# Patient Record
Sex: Male | Born: 1950
Health system: Midwestern US, Community
[De-identification: ages and names within clinical notes are randomized; demographics above are authoritative.]

## PROBLEM LIST (undated history)

## (undated) DIAGNOSIS — G629 Polyneuropathy, unspecified: Secondary | ICD-10-CM

## (undated) DIAGNOSIS — I10 Essential (primary) hypertension: Secondary | ICD-10-CM

## (undated) DIAGNOSIS — N400 Enlarged prostate without lower urinary tract symptoms: Secondary | ICD-10-CM

## (undated) DIAGNOSIS — M5431 Sciatica, right side: Secondary | ICD-10-CM

## (undated) HISTORY — PX: SHOULDER SURGERY: SHX246

## (undated) HISTORY — DX: Polyneuropathy, unspecified: G62.9

## (undated) HISTORY — DX: Essential (primary) hypertension: I10

## (undated) HISTORY — PX: LITHOTRIPSY: SUR834

## (undated) HISTORY — PX: ABDOMINAL SURGERY: SHX537

## (undated) HISTORY — PX: KNEE SURGERY: SHX244

## (undated) HISTORY — PX: NEPHROSTOMY: SHX1014

## (undated) HISTORY — DX: Benign prostatic hyperplasia without lower urinary tract symptoms: N40.0

## (undated) HISTORY — DX: Sciatica, right side: M54.31

---

## 2016-02-15 LAB — GLUCOSE, POC
Glucose (POC): 128 MG/DL — ABNORMAL HIGH (ref 70–110)
Performed by: 35714

## 2016-02-15 LAB — CBC WITH AUTOMATED DIFF
ABS. BASOPHILS: 0 10*3/uL (ref 0.00–0.10)
ABS. EOSINOPHILS: 0.2 10*3/uL (ref 0.00–0.50)
ABS. IMM. GRANS.: 0.03 10*3/uL (ref 0–0.03)
ABS. LYMPHOCYTES: 1.2 10*3/uL (ref 1.20–3.70)
ABS. MONOCYTES: 0.6 10*3/uL (ref 0.20–0.80)
ABS. NEUTROPHILS: 7.4 10*3/uL — ABNORMAL HIGH (ref 1.56–6.13)
BASOPHILS: 0.2 %
EOSINOPHILS: 1.7 %
HCT: 42.3 % (ref 40.1–51.0)
HGB: 14.2 GM/DL (ref 13.7–17.5)
IMMATURE GRANULOCYTES: 0.3 % (ref 0–0.43)
LYMPHOCYTES: 12.8 %
MCH: 28.2 PG (ref 25.6–32.2)
MCHC: 33.6 G/DL (ref 32.2–35.5)
MCV: 84.1 FL (ref 80–95)
MONOCYTES: 6.1 %
MPV: 10 FL (ref 9.4–12.4)
NEUTROPHILS: 78.9 %
NRBC: 0 /100 WBC (ref 0–0.02)
PLATELET: 190 10*3/uL (ref 150–400)
RBC: 5.03 M/ul (ref 4.51–5.93)
RDW: 14.4 % (ref 11.6–14.4)
WBC: 9.4 10*3/uL (ref 4.0–10.0)

## 2016-02-15 LAB — METABOLIC PANEL, BASIC
Anion gap: 9.9 MMOL/L (ref 5–15)
BUN: 17 MG/DL (ref 7–18)
CO2: 25.1 MMOL/L (ref 21–32)
Calcium: 8.7 MG/DL (ref 8.5–10.1)
Chloride: 103 MMOL/L (ref 98–110)
Creatinine: 1.3 mg/dL (ref 0.7–1.3)
Estimated GFR: 55 mL/min/{1.73_m2} — ABNORMAL LOW (ref >60)
Glucose: 135 MG/DL (ref 74–140)
Potassium: 4.2 MMOL/L (ref 3.5–5.1)
Sodium: 138 MMOL/L (ref 136–145)

## 2016-02-15 LAB — LIPASE: Lipase: 157.6 U/L (ref 73–393)

## 2016-02-16 NOTE — H&P (Signed)
[facility] [address] [place] [id number] [phone]      HISTORY AND PHYSICAL    Patient Name: Omar FavorBOURQUE , Gerad Medical Record Number: [id number]   Account Number: [id number] DOB: 08 / 11 / 1952   Admit Date: 10 / 27 / 2017 Discharge Date: / /         DATE OF ADMISSION:  02/15/2016    CHIEF COMPLAINT:  Dizziness.    HISTORY OF PRESENT ILLNESS:  The patient is a 65 year old man with a history of coronary artery disease, who  came to the emergency room complaining of dizziness.  He says that he was home  today and suddenly around noon he got up to go out with his wife, and he  suddenly felt very dizzy.  He said it felt like he was spinning around.  He  went in to lie down in bed for awhile and his symptoms felt worse when he was  in bed.  He then started having nausea and vomiting.  He and his wife were  concerned that he was having some cardiac problems, called an ambulance, and he  was brought to the emergency room.  He did receive some Zofran and Valium, and  was feeling slightly better; however, when he stood up to try to go home, he  became very dizzy and unsteady.  He also had some nausea without vomiting.  He  is not complaining of any chest pain, shortness of breath or abdominal pain.  No headache.  He had no visual changes.    PAST MEDICAL HISTORY:  1.  Coronary artery disease; he said 2 years ago he was found have a 99%  stenosis in 1 of his coronary arteries, he had a stent placed, has not have any  problems since then.  2.  Benign prostatic hypertrophy.  3.  Hyperlipidemia.  4.  History of diverticulitis; he did require surgery last year.    MEDICATIONS:  Aspirin 81 milligrams a day.  Atorvastatin 80 milligrams daily.  Effient 10 milligrams a day.  Metoprolol tartrate 25 milligrams twice a day.  Tamsulosin 0.4 milligrams once a day.    ALLERGIES:  VICODIN.    FAMILY HISTORY:  Mother had a massive heart attack and died at age 66, she was diabetic.  Does  not know about his father.     SOCIAL HISTORY:  He is married.  Does not smoke or drink alcohol.    Review of systems.:  Neurologic:  He does complain of some numbness and tingling in his feet.  Cardiac:  He has not had any chest pain since his stent was placed 2 years ago.  No palpitations or shortness of breath.  All other systems were reviewed and  were negative except for the history of present illness.    PHYSICAL EXAMINATION:  He did not appear to be acutely ill or in any distress.  Temperature was 36.3,  pulse 64, respirations 16, blood pressure 149/79.  HEENT exam:  Sclerae were  nonicteric.  He had no periorbital edema.  Neck:  No thyromegaly or adenopathy.  Heart:  Was regular, no murmur or gallops.  Lungs:  Clear to auscultation  bilaterally.  Abdomen:  Soft and nontender.  He had no mass or organomegaly.  Musculoskeletal:  He had no clubbing, cyanosis or calf tenderness.  He did have  a small abrasion on the second toe on the right foot.  Neurologic:  He is  alert, appropriated, appropriate.  Extraocular movements are Intact.  He did  not have any focal weakness.  He did not have any nystagmus.    Electrocardiogram showed sinus rhythm with a nonspecific intraventricular  conduction delay.    IMPRESSION:  1.  Dizziness.  It sounds like vertigo, most likely acute labyrinthitis.  He  was unable to stand and walk, and could not be safely discharged from the  emergency room.  He does not have any symptoms suggesting cerebrovascular  disease.  Will be admitted for overnight observation..  2.  Coronary artery disease is stable on aspirin, Effient and metoprolol.  3.  Dyslipidemia.  On atorvastatin.  4.  Benign prostatic hypertrophy.  On tamsulosin.    The patient does not have an advance directive.  He is a FULL CODE.      ______________________________  Isabella BowensKevin J Agustine Rossitto, MD    KJG/ac  D:  02/15/2016  11:54 PM  T:  02/16/2016  05:00 AM  DVI Job #:  [id number]  Fusion Job #:  [id number]  Doc#:  [id number]    CC: Isabella BowensKevin J Eugenia Eldredge, MD  [facility]   [address]  [place], [place] [id number]  Fax#:  [phone]  Lysbeth PennerSunit Mukherjee    Electronically Authenticated by:  Marlene LardKevin Labib Cwynar, MD On 02/17/2016 06:31 AM EDT

## 2016-02-17 NOTE — Discharge Summary (Signed)
[facility] [address] [place] [id number] [phone]      DISCHARGE SUMMARY    Patient Name: Omar FavorBOURQUE , Davie Medical Record Number: [id number]  Account Number: [id number] DOB: 08 / 11 / 1952  Admit Date: 10 / 27 / 2017 Discharge Date: 10 / 29 / 2017        DATE OF BIRTH:  [birth year].    1.  Dizziness due to vasovagal effect, vertigo, acute, initial encounter   improved.    SECONDARY DIAGNOSIS:  1.  Obesity; BMI is 33.43.  2.  Coronary artery disease with one cardiac stent placed.  3.  Dyslipidemia.  4.  Benign prostatic hyperplasia.    OPERATIONS AND PROCEDURES:  During the hospital stay the patient was evaluated with the following   investigations  CT scan of the head without contrast from 02/15/2016:  Impression:  No acute   intracranial hemorrhage or infarct.    X-ray chest from 02/15/2016:  Impression bibasilar densities compatible with   atelectasis/scarring.  Right hilar prominence.    Lipase level from 02/15/2016:  157.6 units/liter.    Chemistry-7 from 02/15/2016 sodium 138 millimoles per liter, potassium 4.2   millimoles per liter, chloride 103 millimoles per liter, CO2 is 25.1 millimoles   per liter, BUN 17 milligrams per dL, creatinine 1.3 milligrams per dL, glucose   564135 milligrams per dL, calcium 8.7 milligrams per dL, anion gap 9.9 millimoles   per liter, GFR 55.    Complete blood count from 02/15/2016:  WBC 9.4, hemoglobin 14.2 g/dL,  hematocrit 33.2%42.3%, platelet 190.    DISCHARGE DIET: 2 grams sodium low-cholesterol diet.    DISCHARGE ACTIVITY: as tolerated.    CONSULTATIONS:  None.    FOLLOWUP PLAN:  The patient is advised to follow up with primary care physician in 1 to 2 weeks   and as needed.  The patient is advised to call for appointment.    SPECIAL INSTRUCTIONS:  1.  The patient is advised to get physical exercise and maintain healthy food   habits for obesity.  2.  The patient is advised to get CT scan of the chest for evaluation of right    hilar prominence after discussing with primary care physician.  3.  The patient is advised to return to the emergency room if he experiences   acute chest pain, acute shortness of breath.    CODE STATUS:  Full code.    DISCHARGE MEDICATIONS:  The patient will be discharged with the following medications.  Aspirin 81 mg p.o. daily as before.  Atorvastatin 80 mg p.o. at bedtime as intense statin therapy as before.  Effient 10 mg daily as before.  Meclizine 25 milligrams p.o. two times daily p.r.n. vertigo.  Metoprolol tartrate 25 milligrams p.o. two times daily as before.  Tamsulosin 0.4 milligrams daily as before.  The patient is provided a script for meclizine 25 milligrams on discharge.    CONDITION ON DISCHARGE:  I evaluated the patient today on 02/17/2016 and found comfortable.  He does not   have any orthostatic hypotension or tachycardia.  He is ambulating without any   distress as per [employer] Megan, who ambulated the patient on the floor.  The patient   denied any dizziness.  No blurry vision.  He denied any central chest pain, no   palpitations.  No cough, no pleuritic chest pain, no wheezing, no shortness of   breathing.  He denied any abdominal pain.  No nausea no vomiting, no  diarrhea.   No hematemesis, no melena, no hematochezia.  He denied any dysuria.  No   increased frequency of micturition, no hematuria, no retention of urine.  Vitals are as follows:  Temperature 36 degrees centigrade, pulse 68 beats per   minute, respirations 17 per minute, blood pressure 127/76 mmHg.  Oxygen   saturation is 94% on room air.  On examination, he is awake, alert, oriented x3.    Head:  Atraumatic, normocephalic.  Eyes:  Pupillary reflex are present equally bilateral.  No ptosis.  Extraocular   muscles are intact.  Ear, nose, throat: within normal limits.  Skin:  No ecchymosis, no rash.  Oral mucous membranes are moist.  No ulcer.  RN Carollee HerterShannon also checked the skin with me at the bedside and agreed.   Lymphatic system:  No lymph nodes are palpable in the cervical, axillary,   inguinal regions.  Neck:  Supple.  No JVD.  No carotid bruit.  No thyromegaly.  Respiratory system:  No wheezing, no rales, rhonchi.  Cardiovascular system:  S1, S2 are audible.  Regular rate and rhythm clinically.  No murmur or gallop rhythm.  Gastrointestinal system:  Abdomen:  Not distended, not tender, no peritoneal   signs.  No organomegaly.  Bowel sounds audible.  Obese.  Genitourinary system:  External genitalia are within normal limits as per the   patient.  External genitalia is not examined.  Musculoskeletal system:  No dependent edema.  Denna HaggardHomans' signs are negative   bilaterally.  Peripheral pulses are palpable in four limbs. Muscle power; 5/5   in 4 limbs.  Nervous system: Cranial II-XII and peripheral nervous systems are grossly   intact.  Psychiatric evaluation: cooperative, pleasant, not depressed.    As per discussion and agreement with the patient, he will be discharged to home   today.  During the evaluation discussion with the patient, first time RN Carollee HerterShannon and   second time RN Lindie SpruceMeghan was present at the bedside.  I informed the patient about the radiological findings of CT scan of the head   and x-ray chest.    HISTORY:  Date of admission 02/15/2016:  Date of discharge 02/17/2016:    65 year old gentleman with past medical history of coronary artery disease with   one cardiac stent placement, dyslipidemia, was evaluated and admitted for   dizziness and moderate unsteadiness.  Please refer to the history and physical for full history of the patient.    HOSPITAL COURSE:  During the hospital stay, the patient was improving of his dizziness.  Yesterday when I first evaluated the patient on 02/16/2016, he was still   complaining of dizziness. He was waiting to be evaluated by physical therapist.  He was evaluated by CT scan of the head which returned negative reported today.   The patient is ambulated by RN today and he is ambulating without any distress.  The patient denied any other medical complaints.  As the patient remained hemodynamically neurologically stable, I discussed with   the patient about discharge, followup, rehabilitation plan and he agreed and   appreciated to be discharged to home today.    I could not call the primary care physician as I do not have the primary care   physician's telephone number. I requested the patient to call and follow up   with primary care physician.  All questions were answered.    When I first started to follow the patient on 02/16/2016, I discussed with the   patient about palliative  care plan, advance directive and code status for which   I spent separately 16 minutes.  The patient confirmed FULL code status.    DISPOSITION:  The patient will be discharged to home with followup advised with the primary   care physician as advised and as needed.    Total time spent evaluating the patient, discussing with the patient about   discharge, followup, rehabilitation plan, writing script for meclizine and   completing discharge paperwork. :50 minutes.    ______________________________  Blima RichAlok K Genetta Fiero, MD    AKS/dp  D:  02/17/2016  10:59 AM  T:  02/19/2016  03:59 PM  DVI Job #:  [id number]  Fusion Job #:  [id number]  Doc#:  [id number]    CC: Blima RichAlok K Analiese Krupka, MD  [facility]  [address]  [place] [id number]  Lysbeth PennerSunit Mukherjee    Electronically Authenticated and Edited by:  Steward RosAlok Dominga Mcduffie, MD On 02/20/2016 08:05 PM EDT

## 2016-02-17 NOTE — Discharge Summary (Signed)
[facility] [address] [place] [address] [phone]      DISCHARGE SUMMARY    Patient Name: [name] , Omar Medical Record Number: [id number]  Account Number: [id number] DOB: 08 / 11 / 1952  Admit Date: 10 / 27 / 2017 Discharge Date: 10 / 29 / 2017        ADDENDUM    Add in the hospital course section:  I discussed with the patient about x-ray chest finding suggestive of right   hilar prominence, significance is not understood.  As per radiologist's   recommendation, follow-up chest CT was recommended for further evaluation.  The patient does not have any  chest pain.   I offered the patient  either he   can wait and I can get the CT scan of the chest before discharge or he can get   the CT scan of the chest as outpatient after discussing with primary care   physician. The patient wanted to get the CT scan of the chest as outpatient   after discussing with his primary care physician which was witnessed by RN,   Megan, at the bedside.        ______________________________  Davia Smyre K Adit Riddles, MD    AKS/cc  D:  02/17/2016  11:06 AM  T:  02/19/2016  03:42 PM  DVI Job #:  [id number]  Fusion Job #:  [id number]  Doc#:  [id number]  Electronically Authenticated and Edited by:  Daleen Steinhaus, MD On 02/20/2016 08:08 PM EDT

## 2016-02-17 NOTE — Discharge Summary (Signed)
[facility] [address] [place] [phone]      DISCHARGE SUMMARY    Patient Name: Omar FavorBOURQUE , Roque Medical Record Number: [id number]  Account Number: [id number] DOB: 08 / 11 / 1952  Admit Date: 10 / 27 / 2017 Discharge Date: 10 / 29 / 2017        DATE OF BIRTH:  April 09, 1951.    1.  Dizziness due to vasovagal effect, vertigo, acute, initial encounter   improved.    SECONDARY DIAGNOSIS:  1.  Obesity; BMI is 33.43.  2.  Coronary artery disease with one cardiac stent placed.  3.  Dyslipidemia.  4.  Benign prostatic hyperplasia.    OPERATIONS AND PROCEDURES:  During the hospital stay the patient was evaluated with the following   investigations  CT scan of the head without contrast from 02/15/2016:  Impression:  No acute   intracranial hemorrhage or infarct.    X-ray chest from 02/15/2016:  Impression bibasilar densities compatible with   atelectasis/scarring.  Right hilar prominence.    Lipase level from 02/15/2016:  157.6 units/liter.    Chemistry-7 from 02/15/2016 sodium 138 millimoles per liter, potassium 4.2   millimoles per liter, chloride 103 millimoles per liter, CO2 is 25.1 millimoles   per liter, BUN 17 milligrams per dL, creatinine 1.3 milligrams per dL, glucose   528135 milligrams per dL, calcium 8.7 milligrams per dL, anion gap 9.9 millimoles   per liter, GFR 55.    Complete blood count from 02/15/2016:  WBC 9.4, hemoglobin 14.2 g/dL,  hematocrit 41.3%42.3%, platelet 190.    DISCHARGE DIET: 2 grams sodium low-cholesterol diet.    DISCHARGE ACTIVITY: as tolerated.    CONSULTATIONS:  None.    FOLLOWUP PLAN:  The patient is advised to follow up with primary care physician in 1 to 2 weeks   and as needed.  The patient is advised to call for appointment.    SPECIAL INSTRUCTIONS:  1.  The patient is advised to get physical exercise and maintain healthy food   habits for obesity.  2.  The patient is advised to get CT scan of the chest for evaluation of right   hilar prominence after discussing with  primary care physician.  3.  The patient is advised to return to the emergency room if he experiences   acute chest pain, acute shortness of breath.    CODE STATUS:  Full code.    DISCHARGE MEDICATIONS:  The patient will be discharged with the following medications.  Aspirin 81 mg p.o. daily as before.  Atorvastatin 80 mg p.o. at bedtime as intense statin therapy as before.  Effient 10 mg daily as before.  Meclizine 25 milligrams p.o. two times daily p.r.n. vertigo.  Metoprolol tartrate 25 milligrams p.o. two times daily as before.  Tamsulosin 0.4 milligrams daily as before.  The patient is provided a script for meclizine 25 milligrams on discharge.    CONDITION ON DISCHARGE:  I evaluated the patient today on 02/17/2016 and found comfortable.  He does not   have any orthostatic hypotension or tachycardia.  He is ambulating without any   distress as per [employer] Megan, who ambulated the patient on the floor.  The patient   denied any dizziness.  No blurry vision.  He denied any central chest pain, no   palpitations.  No cough, no pleuritic chest pain, no wheezing, no shortness of   breathing.  He denied any abdominal pain.  No nausea no vomiting, no  diarrhea.   No hematemesis, no melena, no hematochezia.  He denied any dysuria.  No   increased frequency of micturition, no hematuria, no retention of urine.  Vitals are as follows:  Temperature 36 degrees centigrade, pulse 68 beats per   minute, respirations 17 per minute, blood pressure 127/76 mmHg.  Oxygen   saturation is 94% on room air.  On examination, he is awake, alert, oriented x3.    Head:  Atraumatic, normocephalic.  Eyes:  Pupillary reflex are present equally bilateral.  No ptosis.  Extraocular   muscles are intact.  Ear, nose, throat: within normal limits.  Skin:  No ecchymosis, no rash.  Oral mucous membranes are moist.  No ulcer.  RN Carollee Herter also checked the skin with me at the bedside and agreed.  Lymphatic system:  No lymph nodes are palpable in the cervical,  axillary,   inguinal regions.  Neck:  Supple.  No JVD.  No carotid bruit.  No thyromegaly.  Respiratory system:  No wheezing, no rales, rhonchi.  Cardiovascular system:  S1, S2 are audible.  Regular rate and rhythm clinically.  No murmur or gallop rhythm.  Gastrointestinal system:  Abdomen:  Not distended, not tender, no peritoneal   signs.  No organomegaly.  Bowel sounds audible.  Obese.  Genitourinary system:  External genitalia are within normal limits as per the   patient.  External genitalia is not examined.  Musculoskeletal system:  No dependent edema.  Denna Haggard' signs are negative   bilaterally.  Peripheral pulses are palpable in four limbs. Muscle power; 5/5   in 4 limbs.  Nervous system: Cranial II-XII and peripheral nervous systems are grossly   intact.  Psychiatric evaluation: cooperative, pleasant, not depressed.    As per discussion and agreement with the patient, he will be discharged to home   today.  During the evaluation discussion with the patient, first time RN Carollee Herter and   second time RN Lindie Spruce was present at the bedside.  I informed the patient about the radiological findings of CT scan of the head   and x-ray chest.    HISTORY:  Date of admission 02/15/2016:  Date of discharge 02/17/2016:    65 year old gentleman with past medical history of coronary artery disease with   one cardiac stent placement, dyslipidemia, was evaluated and admitted for   dizziness and moderate unsteadiness.  Please refer to the history and physical for full history of the patient.    HOSPITAL COURSE:  During the hospital stay, the patient was improving of his dizziness.  Yesterday when I first evaluated the patient on 02/16/2016, he was still   complaining of dizziness. He was waiting to be evaluated by physical therapist.  He was evaluated by CT scan of the head which returned negative reported today.  The patient is ambulated by RN today and he is ambulating without any distress.  The patient denied any other medical  complaints.  As the patient remained hemodynamically neurologically stable, I discussed with   the patient about discharge, followup, rehabilitation plan and he agreed and   appreciated to be discharged to home today.    I could not call the primary care physician as I do not have the primary care   physician's telephone number. I requested the patient to call and follow up   with primary care physician.  All questions were answered.    When I first started to follow the patient on 02/16/2016, I discussed with the   patient about palliative  care plan, advance directive and code status for which   I spent separately 16 minutes.  The patient confirmed FULL code status.    DISPOSITION:  The patient will be discharged to home with followup advised with the primary   care physician as advised and as needed.    Total time spent evaluating the patient, discussing with the patient about   discharge, followup, rehabilitation plan, writing script for meclizine and   completing discharge paperwork. :50 minutes.    ______________________________  Blima RichAlok K Genetta Fiero, MD    AKS/dp  D:  02/17/2016  10:59 AM  T:  02/19/2016  03:59 PM  DVI Job #:  [id number]  Fusion Job #:  [id number]  Doc#:  [id number]    CC: Blima RichAlok K Analiese Krupka, MD  [facility]  [address]  [place] [id number]  Lysbeth PennerSunit Mukherjee    Electronically Authenticated and Edited by:  Steward RosAlok Dominga Mcduffie, MD On 02/20/2016 08:05 PM EDT

## 2016-02-17 NOTE — Discharge Summary (Signed)
[facility] [address] [place] [id number] [phone]      DISCHARGE SUMMARY    Patient Name: Omar [name] , Omar [name] Medical Record Number: [id number]  Account Number: [id number] DOB: 08 / 11 / 1952  Admit Date: 10 / 27 / 2017 Discharge Date: 10 / 29 / 2017        ADDENDUM    Add in the hospital course section:  I discussed with the patient about x-ray chest finding suggestive of right   hilar prominence, significance is not understood.  As per radiologist's   recommendation, follow-up chest CT was recommended for further evaluation.  The patient does not have any  chest pain.   I offered the patient  either he   can wait and I can get the CT scan of the chest before discharge or he can get   the CT scan of the chest as outpatient after discussing with primary care   physician. The patient wanted to get the CT scan of the chest as outpatient   after discussing with his primary care physician which was witnessed by RN,   Aundra MilletMegan, at the bedside.        ______________________________  Blima RichAlok K Orrie Schubert, MD    AKS/cc  D:  02/17/2016  11:06 AM  T:  02/19/2016  03:42 PM  DVI Job #:  [id number]  Fusion Job #:  [id number]  Doc#:  [id number]  Electronically Authenticated and Edited by:  Steward RosAlok Miaa Latterell, MD On 02/20/2016 08:08 PM EDT

## 2016-05-28 ENCOUNTER — Ambulatory Visit: Admitting: Otolaryngology

## 2016-05-28 ENCOUNTER — Ambulatory Visit

## 2016-05-28 NOTE — Progress Notes (Signed)
---    **[facility] New**    ---    **Patient:** Jason Melton    **Provider:** Cathie Olden, MD    **DOB:** June 09, 1950 **Age:** 3 Y **Sex:** Male    **Date:** 05/28/2016    **PCP:** VLADIMIR AVRAMOV          * * *        ---        **Reason for Appointment**    ---      1\. Vertigo    ---      **History of Present Illness**    ---    __ :    66 year old gentleman seen in the office for vertigo. His wife accompanies  him. Reports that a few months back he awoke and had room spinning vertigo.  Could not move because of it. Even when laying in bed felt dizzy. Ultimately  called an ambulance and was hospitalized for 3 days. Was given medication to  the IV which helped. Underwent extensive evaluation without obvious cause.  Advised to see ENT. No otalgia, otorrhea, tinnitus. No ear blockage. Slightly  dizzy at times now but not as severe and not as often.      **Current Medications**    ---    Taking        * metoprolol         ---          * atorvastatin         ---          * aspirin         ---          * Effient         ---          * tamsulosin         ---          * Medication List reviewed and reconciled with the patient         ---      **Past Medical History**    ---      Heart disease    ---    Kidney stones    ---    High cholesterol    ---    Arthritis    ---      **Surgical History**    ---      joint surgery    ---    colon surgery    ---      **Family History**    ---      Father: unknown, diagnosed with Unk Fam    ---    diabetes,stroke,arthritis.    ---      **Social History**    ---    no Smoking  Are you a: nonsmoker.    no Recreational drug use.    Alcohol: yes, social.      **Allergies**    ---      Vicodin    ---      **Review of Systems**    ---    _A detailed ROS was performed and is negative except for positives noted  below: (ROS sheet in office chart)._ :    Loss of hearing Yes. Ringing in ears/tinnitus yes. Dizziness/vertigo yes.  Exposure to loud noise yes. Sinus problems  yes. Shortness of breath yes.  Vision problems yes. Easy bleeding or bruising yes. Joint pain yes. Second  hand smoke exposure yes.          **Examination**    ---  _Constitutional_ :    General Appearance:  NAD  .    _Orbits_ :    EOM's  intact,  no spontaneous nystagmus  .    _Ear_ :    Pinnae:  normal R & L Pinnae  . EAC's:  normal R and L EAC  . TMs:  normal R &  L TMs  .    _Nose_ :    Speculum exam:  unremarkable  .    _Oral cavity/Oropharynx_ :    Lips:  normal  . FOM:  normal  . Tongue:  normal  . Palate:  normal  .  Oropharynx:  normal  . Oral vestibule:  normal  .    _Neck_ :    Palpation:  No masses, adenopathy  .    _Skin of Head/Neck_ :    .  no obvious lesions  .    _Cranial Nerve assessment_ :    Nerves 3 - 12:  normal  .    _Audiology_ :    Indication-  .  . interpreted and reviewed with patient. Scanned into record  .  Marland Kitchen          **Assessments**    ---    1\. Vertigo - R42 (Primary)    ---    2\. 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 Vertigo**    Notes: Fortunately he has not had any further attacks of vertigo for short  while now. Based on the history and negative workup I suspect he had  neuronitis. The clinical history is usually one of improvement with occasional  relapse. This matches what he is experiencing. No specific treatment is  required but should improve with time. If any severe vertigo will call to be  seen on an urgent basis for evaluation and treatment.    ---        **2\. Sensorineural hearing loss (SNHL) of both ears**    _LAB: .Audiogram_    Notes: I reviewed the audiogram with the patient. 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 Strategies  for improving comprehension reviewed including always facing the person they  are talking to and using the lips as cues. Assisted listening devices were  discussed. They are a candidate for amplification and they are interested  in  pursuing hearing aids. Recommend a repeat audiogram in one year to assess for  progression of the hearing loss.      **Procedure Codes**    ---      [id number] CAE    ---      **Follow Up**    ---    prn, patient was instructed to call or return if any concerns, questions, or  symptoms recur    Electronically signed by Cathie Olden , MD on 05/29/2016 at 01:43 PM EST    Sign off status: Completed          * * *      Patient: Jason Melton    Provider: Cathie Olden, MD    ------    DOB: 10/13/1950    Date: 05/28/2016    Note generated by eClinicalWorks EMR/PM Software ([email or web address])    ---

## 2016-06-27 ENCOUNTER — Ambulatory Visit: Admitting: Otolaryngology

## 2016-06-27 NOTE — Progress Notes (Signed)
* * *        **  Jason Melton**    ------    23 Y old Male, DOB: Apr 14, 1951    [address], [place], [place], [place] [address]    Home: [phone]    Provider: Romualdo Bolk        * * *    Web Encounter    ---    Answered by    Telephone, Receptionist    Date: 06/27/2016        Time: 07:16 AM    Caller    Jason Melton    ------            Reason    Update Demographics - Additional Info            Message                      Please Update Demographic Information                Action Taken    [facility] 06/27/2016 09:49:19 AM > updated                * * *                ---          * * *          PatientHarl Melton DOB: 15-Sep-1950 Provider: Cathie Olden K  06/27/2016    ---    Note generated by eClinicalWorks EMR/PM Software ([email or web address])

## 2018-06-22 DIAGNOSIS — M19131 Post-traumatic osteoarthritis, right wrist: Secondary | ICD-10-CM | POA: Diagnosis not present

## 2018-06-22 DIAGNOSIS — W19XXXA Unspecified fall, initial encounter: Secondary | ICD-10-CM | POA: Diagnosis not present

## 2018-06-22 DIAGNOSIS — S62101A Fracture of unspecified carpal bone, right wrist, initial encounter for closed fracture: Secondary | ICD-10-CM | POA: Diagnosis not present

## 2018-07-06 ENCOUNTER — Ambulatory Visit (INDEPENDENT_AMBULATORY_CARE_PROVIDER_SITE_OTHER): Payer: Self-pay | Admitting: Family Medicine

## 2018-08-10 DIAGNOSIS — G629 Polyneuropathy, unspecified: Secondary | ICD-10-CM | POA: Diagnosis not present

## 2018-08-10 DIAGNOSIS — I10 Essential (primary) hypertension: Secondary | ICD-10-CM | POA: Diagnosis not present

## 2018-08-10 DIAGNOSIS — E7849 Other hyperlipidemia: Secondary | ICD-10-CM | POA: Diagnosis not present

## 2018-10-05 DIAGNOSIS — S52501D Unspecified fracture of the lower end of right radius, subsequent encounter for closed fracture with routine healing: Secondary | ICD-10-CM | POA: Diagnosis not present

## 2018-10-05 DIAGNOSIS — S52601D Unspecified fracture of lower end of right ulna, subsequent encounter for closed fracture with routine healing: Secondary | ICD-10-CM | POA: Diagnosis not present

## 2018-12-21 DIAGNOSIS — E7849 Other hyperlipidemia: Secondary | ICD-10-CM | POA: Diagnosis not present

## 2018-12-21 DIAGNOSIS — N182 Chronic kidney disease, stage 2 (mild): Secondary | ICD-10-CM | POA: Diagnosis not present

## 2018-12-21 DIAGNOSIS — I10 Essential (primary) hypertension: Secondary | ICD-10-CM | POA: Diagnosis not present

## 2018-12-21 DIAGNOSIS — G629 Polyneuropathy, unspecified: Secondary | ICD-10-CM | POA: Diagnosis not present

## 2018-12-21 DIAGNOSIS — Z0000 Encounter for general adult medical examination without abnormal findings: Secondary | ICD-10-CM | POA: Diagnosis not present

## 2018-12-21 DIAGNOSIS — Z1389 Encounter for screening for other disorder: Secondary | ICD-10-CM | POA: Diagnosis not present

## 2019-02-15 DIAGNOSIS — L280 Lichen simplex chronicus: Secondary | ICD-10-CM | POA: Diagnosis not present

## 2019-02-15 DIAGNOSIS — L278 Dermatitis due to other substances taken internally: Secondary | ICD-10-CM | POA: Diagnosis not present

## 2019-02-15 DIAGNOSIS — L259 Unspecified contact dermatitis, unspecified cause: Secondary | ICD-10-CM | POA: Diagnosis not present

## 2019-02-15 DIAGNOSIS — Z0000 Encounter for general adult medical examination without abnormal findings: Secondary | ICD-10-CM | POA: Diagnosis not present

## 2019-03-02 DIAGNOSIS — Z1159 Encounter for screening for other viral diseases: Secondary | ICD-10-CM | POA: Diagnosis not present

## 2019-03-30 DIAGNOSIS — E7849 Other hyperlipidemia: Secondary | ICD-10-CM | POA: Diagnosis not present

## 2019-03-30 DIAGNOSIS — N182 Chronic kidney disease, stage 2 (mild): Secondary | ICD-10-CM | POA: Diagnosis not present

## 2019-03-30 DIAGNOSIS — L409 Psoriasis, unspecified: Secondary | ICD-10-CM | POA: Diagnosis not present

## 2019-03-30 DIAGNOSIS — Z0000 Encounter for general adult medical examination without abnormal findings: Secondary | ICD-10-CM | POA: Diagnosis not present

## 2019-03-30 DIAGNOSIS — I10 Essential (primary) hypertension: Secondary | ICD-10-CM | POA: Diagnosis not present

## 2019-06-13 DIAGNOSIS — Z0000 Encounter for general adult medical examination without abnormal findings: Secondary | ICD-10-CM | POA: Diagnosis not present

## 2019-06-13 DIAGNOSIS — N23 Unspecified renal colic: Secondary | ICD-10-CM | POA: Diagnosis not present

## 2019-06-13 DIAGNOSIS — S134XXA Sprain of ligaments of cervical spine, initial encounter: Secondary | ICD-10-CM | POA: Diagnosis not present

## 2019-06-16 DIAGNOSIS — R1030 Lower abdominal pain, unspecified: Secondary | ICD-10-CM | POA: Diagnosis not present

## 2019-07-28 DIAGNOSIS — Z87442 Personal history of urinary calculi: Secondary | ICD-10-CM | POA: Diagnosis not present

## 2019-07-28 DIAGNOSIS — Q6239 Other obstructive defects of renal pelvis and ureter: Secondary | ICD-10-CM | POA: Diagnosis not present

## 2019-07-28 DIAGNOSIS — R1084 Generalized abdominal pain: Secondary | ICD-10-CM | POA: Diagnosis not present

## 2019-07-28 DIAGNOSIS — K5730 Diverticulosis of large intestine without perforation or abscess without bleeding: Secondary | ICD-10-CM | POA: Diagnosis not present

## 2019-07-28 DIAGNOSIS — M5136 Other intervertebral disc degeneration, lumbar region: Secondary | ICD-10-CM | POA: Diagnosis not present

## 2019-07-28 DIAGNOSIS — N1330 Unspecified hydronephrosis: Secondary | ICD-10-CM | POA: Diagnosis not present

## 2019-07-31 ENCOUNTER — Encounter (HOSPITAL_COMMUNITY): Payer: Self-pay

## 2019-07-31 ENCOUNTER — Emergency Department (HOSPITAL_COMMUNITY): Payer: Medicare Other

## 2019-07-31 ENCOUNTER — Emergency Department (HOSPITAL_COMMUNITY)
Admission: EM | Admit: 2019-07-31 | Discharge: 2019-07-31 | Disposition: A | Discharge status: Home / Self Care | Payer: Medicare Other | Attending: Emergency Medicine | Admitting: Emergency Medicine

## 2019-07-31 ENCOUNTER — Other Ambulatory Visit: Payer: Self-pay

## 2019-07-31 DIAGNOSIS — R1011 Right upper quadrant pain: Secondary | ICD-10-CM | POA: Insufficient documentation

## 2019-07-31 DIAGNOSIS — Z7982 Long term (current) use of aspirin: Secondary | ICD-10-CM | POA: Diagnosis not present

## 2019-07-31 DIAGNOSIS — Z79899 Other long term (current) drug therapy: Secondary | ICD-10-CM | POA: Diagnosis not present

## 2019-07-31 DIAGNOSIS — R109 Unspecified abdominal pain: Secondary | ICD-10-CM | POA: Diagnosis not present

## 2019-07-31 LAB — CBC WITH DIFFERENTIAL/PLATELET
Abs Immature Granulocytes: 0.02 10*3/uL (ref 0.00–0.07)
Basophils Absolute: 0 10*3/uL (ref 0.0–0.1)
Basophils Relative: 1 %
Eosinophils Absolute: 0.3 10*3/uL (ref 0.0–0.5)
Eosinophils Relative: 5 %
HCT: 43.2 % (ref 39.0–52.0)
Hemoglobin: 14.4 g/dL (ref 13.0–17.0)
Immature Granulocytes: 0 %
Lymphocytes Relative: 31 %
Lymphs Abs: 1.8 10*3/uL (ref 0.7–4.0)
MCH: 29.6 pg (ref 26.0–34.0)
MCHC: 33.3 g/dL (ref 30.0–36.0)
MCV: 88.9 fL (ref 80.0–100.0)
Monocytes Absolute: 0.5 10*3/uL (ref 0.1–1.0)
Monocytes Relative: 8 %
Neutro Abs: 3.2 10*3/uL (ref 1.7–7.7)
Neutrophils Relative %: 55 %
Platelets: 191 10*3/uL (ref 150–400)
RBC: 4.86 MIL/uL (ref 4.22–5.81)
RDW: 14.4 % (ref 11.5–15.5)
WBC: 5.9 10*3/uL (ref 4.0–10.5)
nRBC: 0 % (ref 0.0–0.2)

## 2019-07-31 LAB — COMPREHENSIVE METABOLIC PANEL
ALT: 28 U/L (ref 0–44)
AST: 24 U/L (ref 15–41)
Albumin: 3.4 g/dL — ABNORMAL LOW (ref 3.5–5.0)
Alkaline Phosphatase: 41 U/L (ref 38–126)
Anion gap: 7 (ref 5–15)
BUN: 16 mg/dL (ref 8–23)
CO2: 27 mmol/L (ref 22–32)
Calcium: 8.7 mg/dL — ABNORMAL LOW (ref 8.9–10.3)
Chloride: 102 mmol/L (ref 98–111)
Creatinine, Ser: 1.11 mg/dL (ref 0.61–1.24)
GFR calc Af Amer: >60 mL/min (ref >60)
GFR calc non Af Amer: >60 mL/min (ref >60)
Glucose, Bld: 109 mg/dL — ABNORMAL HIGH (ref 70–99)
Potassium: 4.2 mmol/L (ref 3.5–5.1)
Sodium: 136 mmol/L (ref 135–145)
Total Bilirubin: 0.4 mg/dL (ref 0.3–1.2)
Total Protein: 6.6 g/dL (ref 6.5–8.1)

## 2019-07-31 LAB — URINALYSIS, ROUTINE W REFLEX MICROSCOPIC
Bilirubin Urine: NEGATIVE
Glucose, UA: NEGATIVE mg/dL
Hgb urine dipstick: NEGATIVE
Ketones, ur: NEGATIVE mg/dL
Leukocytes,Ua: NEGATIVE
Nitrite: NEGATIVE
Protein, ur: NEGATIVE mg/dL
Specific Gravity, Urine: 1.015 (ref 1.005–1.030)
pH: 5 (ref 5.0–8.0)

## 2019-07-31 IMAGING — CT CT ABD-PELV W/ CM
2 of 5 series · 15 of 46 positions shown, 17 images · IV contrast (omnipaque)
Comparison: CT abdomen pelvis dated [DATE].

CLINICAL DATA: 68-year-old male with abdominal pain. Concern for
infection or abscess.

EXAM:
CT ABDOMEN AND PELVIS WITH CONTRAST
TECHNIQUE: Multidetector CT imaging of the abdomen and pelvis was performed
using the standard protocol following bolus administration of
intravenous contrast.
CONTRAST:  100mL OMNIPAQUE IOHEXOL 300 MG/ML  SOLN

[Series 2: axial st · axial · 0.88mm/px · z∈[-557,-37]mm · 12 of 120 slices shown, 14 images]
[im 8/120  soft-tissue]
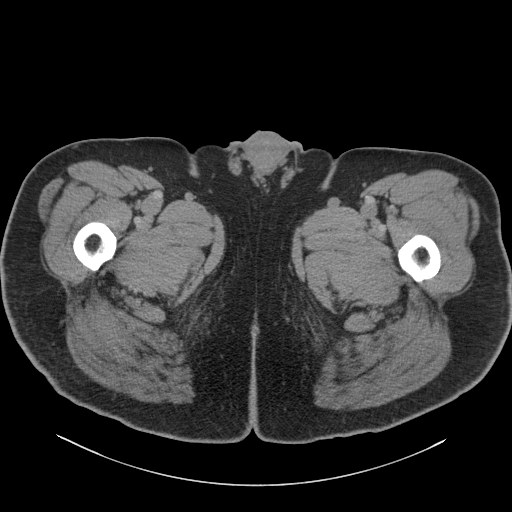
[im 8/120  bone]
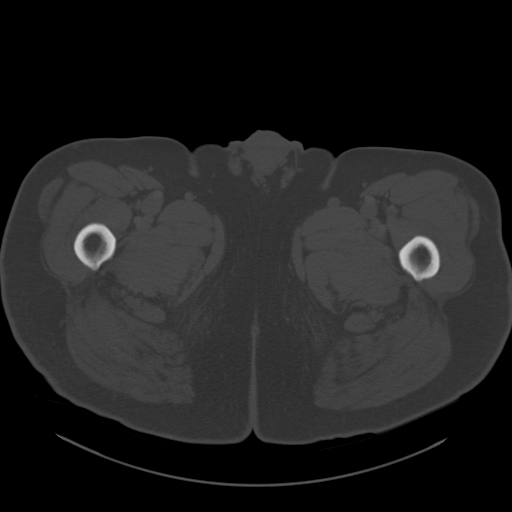
[im 16/120  soft-tissue]
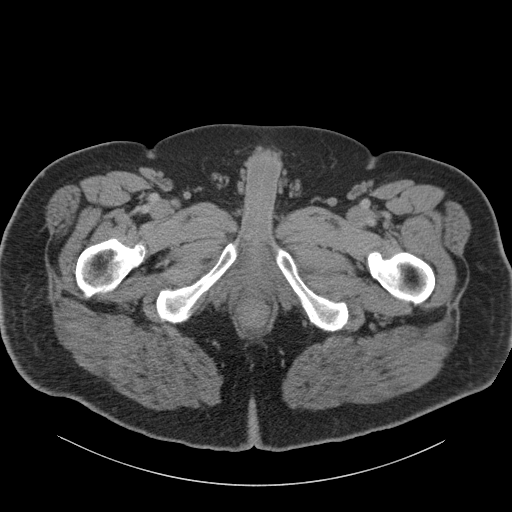
[im 24/120  soft-tissue]
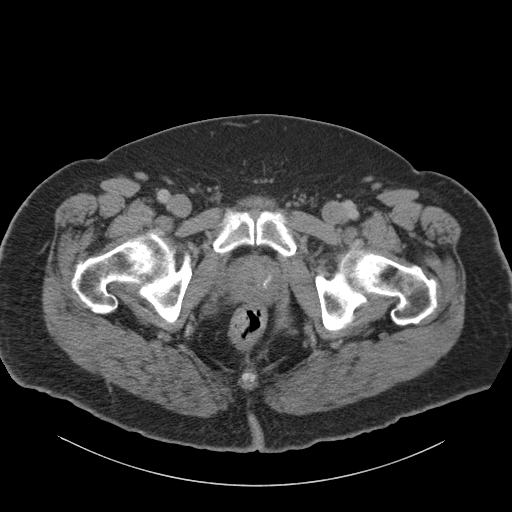
[im 40/120  soft-tissue]
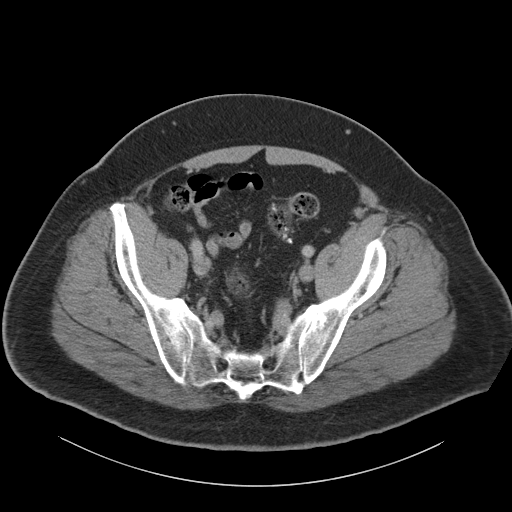
[im 48/120  soft-tissue]
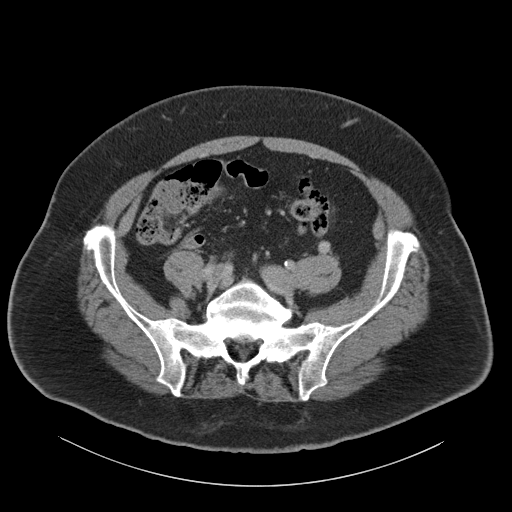
[im 56/120  soft-tissue]
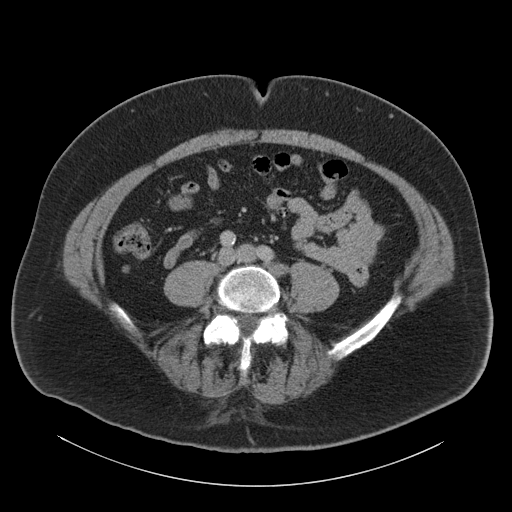
[im 64/120  soft-tissue]
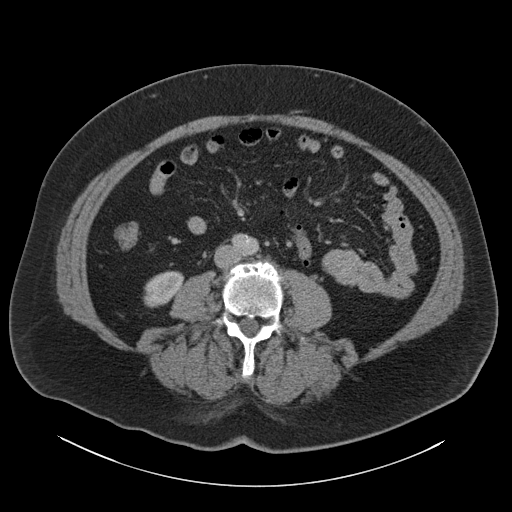
[im 72/120  soft-tissue]
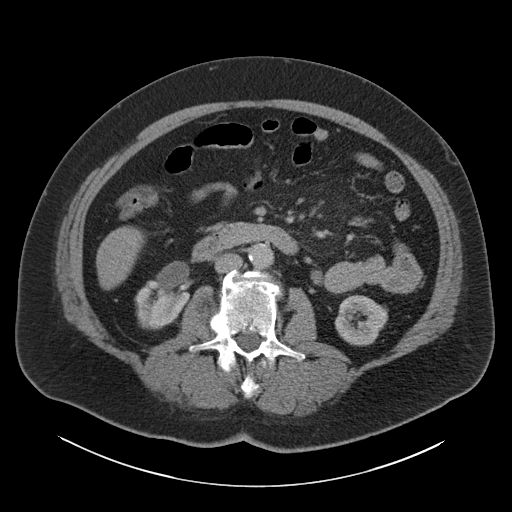
[im 80/120  soft-tissue]
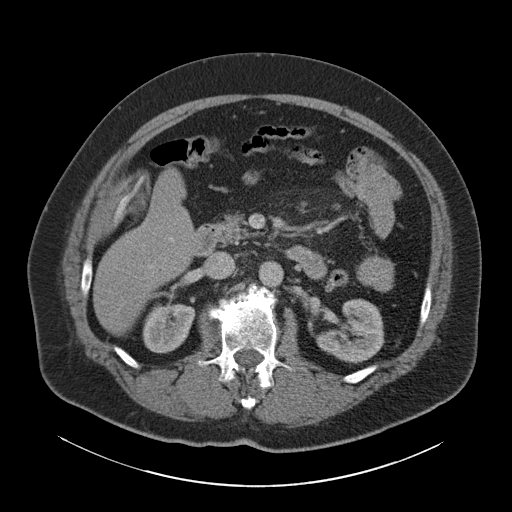
[im 80/120  bone]
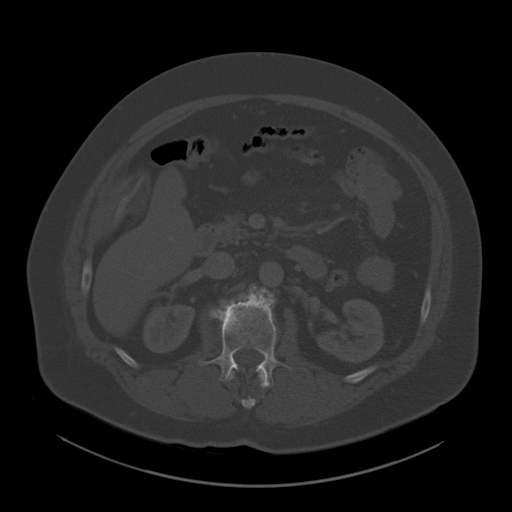
[im 96/120  soft-tissue]
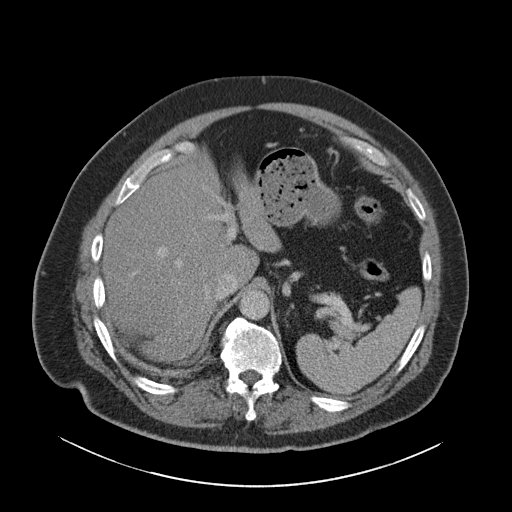
[im 104/120  soft-tissue]
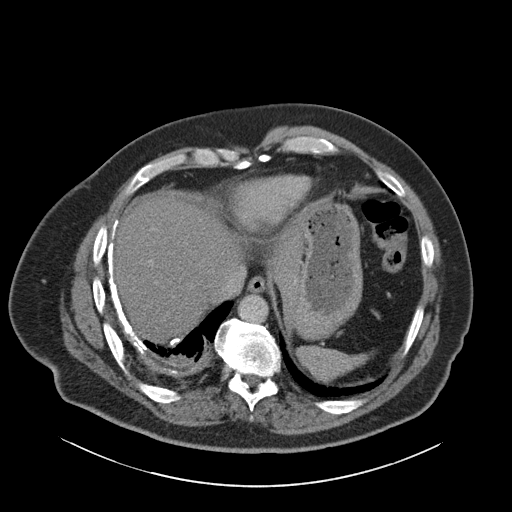
[im 112/120  soft-tissue]
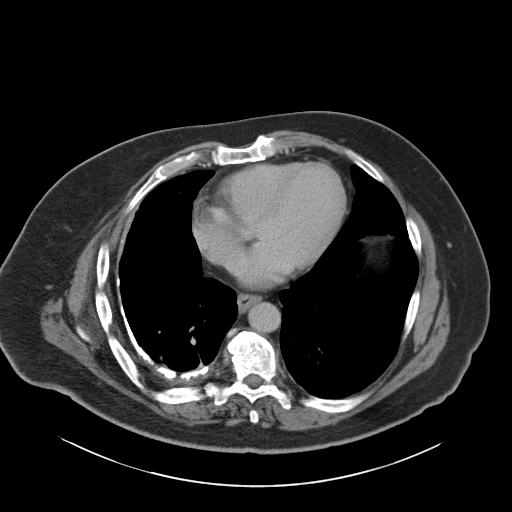

[Series 4: coronal st · coronal · 0.98mm/px · 3 of 163 slices shown]
[im 55/163  soft-tissue]
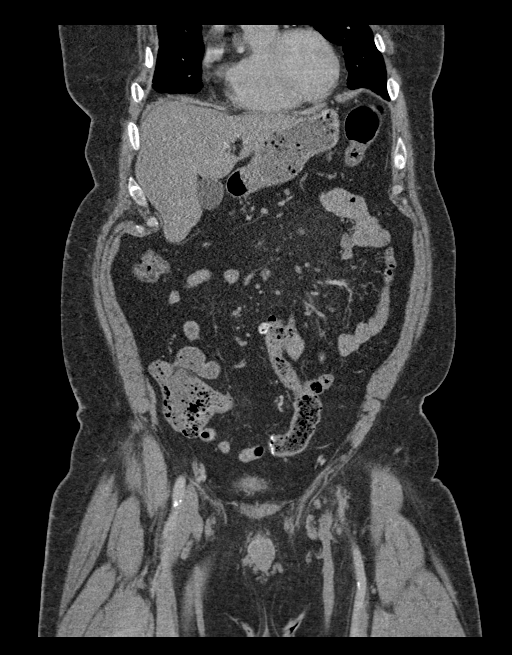
[im 73/163  soft-tissue]
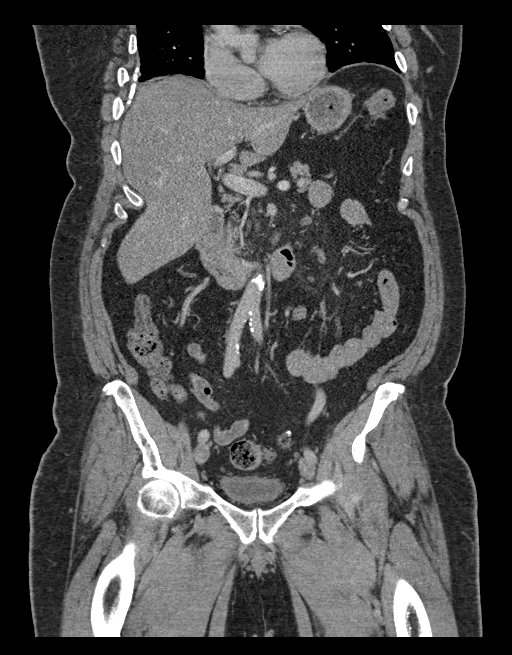
[im 91/163  soft-tissue]
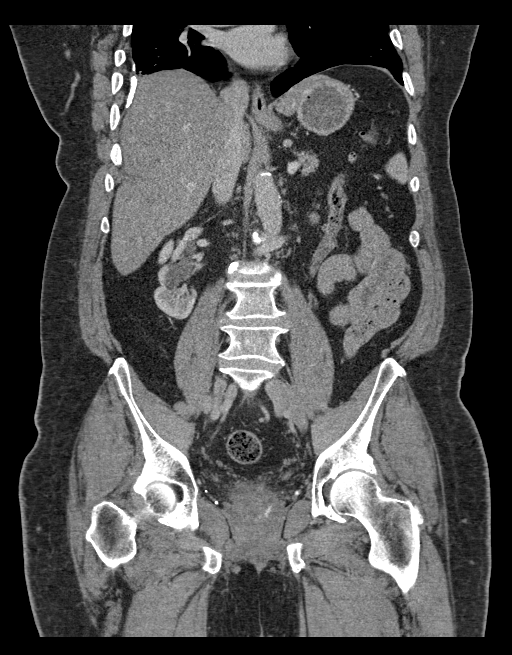

[15 of 46 positions shown; findings below may reference images not displayed]

FINDINGS: Lower chest: Right lung base pleural thickening and calcified
plaques. There is associated right lung base scarring. The
visualized lung bases are otherwise clear. There is coronary
vascular calcification.

No intra-abdominal free air or free fluid.

Hepatobiliary: Fatty liver. No intrahepatic biliary ductal
dilatation. Small gallstones. No pericholecystic fluid.

Pancreas: Unremarkable. No pancreatic ductal dilatation or
surrounding inflammatory changes.

Spleen: Normal in size without focal abnormality.

Adrenals/Urinary Tract: The adrenal glands are unremarkable. There
is a right extrarenal pelvis with mild pelviectasis. There is no
hydronephrosis on either side. There is symmetric enhancement and
excretion of contrast by both kidneys. The visualized ureters appear
unremarkable. The urinary bladder is partially distended. There is
apparent diffuse thickening of the bladder wall which may be partly
related to underdistention. Cystitis is not excluded. Correlation
with urinalysis recommended.

Stomach/Bowel: There is no bowel obstruction. Postsurgical changes
of partial distal colon resection with anastomotic suture. There is
scattered colonic diverticula without active inflammatory changes.
There is no bowel obstruction. The appendix is normal.

Vascular/Lymphatic: Mild aortoiliac atherosclerotic disease. The IVC
is unremarkable. No portal venous gas. There is no adenopathy

Reproductive: The prostate and seminal vesicles are grossly
unremarkable.

Other: None

Musculoskeletal: Osteopenia with degenerative changes of the spine.
No acute osseous pathology.
IMPRESSION: 1. No acute intra-abdominal or pelvic pathology. No bowel
obstruction. Normal appendix.
2. Colonic diverticulosis.
3. Fatty liver.
4. Cholelithiasis.
5. Aortic Atherosclerosis ([Q8]-[Q8]).

## 2019-07-31 MED ORDER — KETOROLAC TROMETHAMINE 30 MG/ML IJ SOLN
15.0000 mg | Freq: Once | INTRAMUSCULAR | Status: AC
  Administered 2019-07-31: 15 mg via INTRAVENOUS
  Filled 2019-07-31: qty 1

## 2019-07-31 MED ORDER — SODIUM CHLORIDE 0.9 % IV BOLUS
1000.0000 mL | Freq: Once | INTRAVENOUS | Status: AC
  Administered 2019-07-31: 18:00:00 1000 mL via INTRAVENOUS

## 2019-07-31 MED ORDER — IOHEXOL 300 MG/ML  SOLN
100.0000 mL | Freq: Once | INTRAMUSCULAR | Status: AC | PRN
  Administered 2019-07-31: 100 mL via INTRAVENOUS

## 2019-07-31 MED ORDER — PREDNISONE 20 MG PO TABS: 40.0000 mg | ORAL_TABLET | Freq: Every day | ORAL | 0 refills | Status: DC

## 2019-07-31 MED ORDER — SODIUM CHLORIDE (PF) 0.9 % IJ SOLN
INTRAMUSCULAR | Status: AC
  Filled 2019-07-31: qty 50

## 2019-07-31 MED ORDER — TRAMADOL HCL 50 MG PO TABS: 50.0000 mg | ORAL_TABLET | Freq: Four times a day (QID) | ORAL | 0 refills | Status: DC | PRN

## 2019-07-31 NOTE — ED Notes (Signed)
An After Visit Summary was printed and given to the patient. Discharge instructions given and no further questions at this time.  Pt leaving with wife.  

## 2019-07-31 NOTE — Discharge Instructions (Addendum)
As discussed, today's evaluation has been reassuring, your CT scan, labs, vital signs were all reassuring, there is not current evidence for an acute new process.  However, with your ongoing pain it is very portly follow-up with your physician on Monday, to discuss consideration of additional causes for your pain, including radiculopathy or nerve pain coming from your spine.  This may be evaluated with an MRI if indicated.  Please discuss today's CT results, as below with your physician.  Return here for any concerning changes in your condition.  CT results: FINDINGS: Lower chest: Right lung base pleural thickening and calcified plaques. There is associated right lung base scarring. The visualized lung bases are otherwise clear. There is coronary vascular calcification.   No intra-abdominal free air or free fluid.   Hepatobiliary: Fatty liver. No intrahepatic biliary ductal dilatation. Small gallstones. No pericholecystic fluid.   Pancreas: Unremarkable. No pancreatic ductal dilatation or surrounding inflammatory changes.   Spleen: Normal in size without focal abnormality.   Adrenals/Urinary Tract: The adrenal glands are unremarkable. There is a right extrarenal pelvis with mild pelviectasis. There is no hydronephrosis on either side. There is symmetric enhancement and excretion of contrast by both kidneys. The visualized ureters appear unremarkable. The urinary bladder is partially distended. There is apparent diffuse thickening of the bladder wall which may be partly related to underdistention. Cystitis is not excluded. Correlation with urinalysis recommended.   Stomach/Bowel: There is no bowel obstruction. Postsurgical changes of partial distal colon resection with anastomotic suture. There is scattered colonic diverticula without active inflammatory changes. There is no bowel obstruction. The appendix is normal.   Vascular/Lymphatic: Mild aortoiliac atherosclerotic disease. The  IVC is unremarkable. No portal venous gas. There is no adenopathy   Reproductive: The prostate and seminal vesicles are grossly unremarkable.   Other: None   Musculoskeletal: Osteopenia with degenerative changes of the spine. No acute osseous pathology.   IMPRESSION: 1. No acute intra-abdominal or pelvic pathology. No bowel obstruction. Normal appendix. 2. Colonic diverticulosis. 3. Fatty liver. 4. Cholelithiasis. 5. Aortic Atherosclerosis (ICD10-I70.0).     Electronically Signed   By: Elgie Collard M.D.   On: 07/31/2019 19:00

## 2019-07-31 NOTE — ED Triage Notes (Signed)
Pt presents with c/o back pain and right groin pain. Pt reports this pain has been present for approx one month. Pt reports he was seen for same recently.

## 2019-07-31 NOTE — ED Provider Notes (Signed)
[facility] DEPT Provider Note   CSN: [id number] Arrival date & time: 07/31/19  1423     History Chief Complaint  Patient presents with  . Back Pain  . Groin Pain    Jonathan Matthews is a 69 y.o. male.  HPI   Patient presents with concern of worsening pain in the right flank, groin.  Onset was about 1 month ago, but symptoms fully become severe over the past 2 days, possibly 1 week. Symptoms are not improved with OTC medication. Patient has no history of renal disease, states that he is generally well, there is no clear precipitant for this illness.  With the worsening pain he was referred by his primary care physician to see our local urology team.  He saw them in the past few days, had what sounds like CT scan, and was scheduled for follow-up next week.  However, with worsening pain over the past 2 days he now presents for evaluation.  The pain is sharp, severe, in the right lateral abdomen, right renal area, without scrotal pain, swelling, urinary changes.   History reviewed. No pertinent past medical history.  There are no problems to display for this patient.   Past Surgical History:  Procedure Laterality Date  . ABDOMINAL SURGERY    . KNEE SURGERY         History reviewed. No pertinent family history.  Social History   Tobacco Use  . Smoking status: Never Smoker  . Smokeless tobacco: Never Used  Substance Use Topics  . Alcohol use: Never  . Drug use: Never    Home Medications Prior to Admission medications   Medication Sig Start Date End Date Taking? Authorizing Provider  aspirin 81 MG EC tablet Take 81 mg by mouth daily.   Yes [provider]  b complex vitamins capsule Take 1 capsule by mouth daily.   Yes [provider]  calcium citrate (CALCITRATE - DOSED IN MG ELEMENTAL CALCIUM) 950 (200 Ca) MG tablet Take 200 mg of elemental calcium by mouth daily.   Yes [provider]  cyclobenzaprine  (FLEXERIL) 10 MG tablet Take 10 mg by mouth 3 (three) times daily. 07/28/19  Yes [provider]  metoprolol tartrate (LOPRESSOR) 25 MG tablet Take 25 mg by mouth daily. 06/13/19  Yes [provider]  tamsulosin (FLOMAX) 0.4 MG CAPS capsule Take 0.4 mg by mouth daily. 07/06/19  Yes [provider]    Allergies    Codeine and Hydrocodone-acetaminophen  Review of Systems   Review of Systems  Constitutional:       Per HPI, otherwise negative  HENT:       Per HPI, otherwise negative  Respiratory:       Per HPI, otherwise negative  Cardiovascular:       Per HPI, otherwise negative  Gastrointestinal: Negative for vomiting.  Endocrine:       Negative aside from HPI  Genitourinary:       Neg aside from HPI   Musculoskeletal:       Per HPI, otherwise negative  Skin: Negative.   Neurological: Negative for syncope.    Physical Exam Updated Vital Signs BP (!) 141/95   Pulse 70   Temp 98 F (36.7 C) (Oral)   Resp 18   SpO2 97%   Physical Exam Vitals and nursing note reviewed.  Constitutional:      General: He is not in acute distress.    Appearance: He is well-developed.  HENT:  Head: Normocephalic and atraumatic.  Eyes:     Conjunctiva/sclera: Conjunctivae normal.  Cardiovascular:     Rate and Rhythm: Normal rate and regular rhythm.  Pulmonary:     Effort: Pulmonary effort is normal. No respiratory distress.     Breath sounds: No stridor.  Abdominal:     General: There is no distension.     Comments: Pain in the right lateral slightly posterior mid abdomen, with guarding.  Otherwise abdominal exam unremarkable.  Skin:    General: Skin is warm and dry.     Findings: No rash.  Neurological:     Mental Status: He is alert and oriented to person, place, and time.     ED Results / Procedures / Treatments   Labs (all labs ordered are listed, but only abnormal results are displayed) Labs Reviewed  COMPREHENSIVE METABOLIC PANEL - Abnormal;  Notable for the following components:      Result Value   Glucose, Bld 109 (*)    Calcium 8.7 (*)    Albumin 3.4 (*)    All other components within normal limits  CBC WITH DIFFERENTIAL/PLATELET  URINALYSIS, ROUTINE W REFLEX MICROSCOPIC    EKG None  Radiology CT Abdomen Pelvis W Contrast  Result Date: 07/31/2019 CLINICAL DATA:  69 year old male with abdominal pain. Concern for infection or abscess. EXAM: CT ABDOMEN AND PELVIS WITH CONTRAST TECHNIQUE: Multidetector CT imaging of the abdomen and pelvis was performed using the standard protocol following bolus administration of intravenous contrast. CONTRAST:  OMNIPAQUE IOHEXOL 300 MG/ML  SOLN COMPARISON:  CT abdomen pelvis dated 07/28/2019. FINDINGS: Lower chest: Right lung base pleural thickening and calcified plaques. There is associated right lung base scarring. The visualized lung bases are otherwise clear. There is coronary vascular calcification. No intra-abdominal free air or free fluid. Hepatobiliary: Fatty liver. No intrahepatic biliary ductal dilatation. Small gallstones. No pericholecystic fluid. Pancreas: Unremarkable. No pancreatic ductal dilatation or surrounding inflammatory changes. Spleen: Normal in size without focal abnormality. Adrenals/Urinary Tract: The adrenal glands are unremarkable. There is a right extrarenal pelvis with mild pelviectasis. There is no hydronephrosis on either side. There is symmetric enhancement and excretion of contrast by both kidneys. The visualized ureters appear unremarkable. The urinary bladder is partially distended. There is apparent diffuse thickening of the bladder wall which may be partly related to underdistention. Cystitis is not excluded. Correlation with urinalysis recommended. Stomach/Bowel: There is no bowel obstruction. Postsurgical changes of partial distal colon resection with anastomotic suture. There is scattered colonic diverticula without active inflammatory changes. There is no  bowel obstruction. The appendix is normal. Vascular/Lymphatic: Mild aortoiliac atherosclerotic disease. The IVC is unremarkable. No portal venous gas. There is no adenopathy Reproductive: The prostate and seminal vesicles are grossly unremarkable. Other: None Musculoskeletal: Osteopenia with degenerative changes of the spine. No acute osseous pathology. IMPRESSION: 1. No acute intra-abdominal or pelvic pathology. No bowel obstruction. Normal appendix. 2. Colonic diverticulosis. 3. Fatty liver. 4. Cholelithiasis. 5. Aortic Atherosclerosis (ICD10-I70.0). Electronically Signed   By: Elgie Collard M.D.   On: 07/31/2019 19:00    Procedures Procedures (including critical care time)  Medications Ordered in ED Medications  sodium chloride (PF) 0.9 % injection (has no administration in time range)  sodium chloride 0.9 % bolus 1,000 mL (0 mLs Intravenous Stopped 07/31/19 1840)  ketorolac (TORADOL) 30 MG/ML injection 15 mg (15 mg Intravenous Given 07/31/19 1735)  iohexol (OMNIPAQUE) 300 MG/ML solution 100 mL (100 mLs Intravenous Contrast Given 07/31/19 1826)    ED Course  I  have reviewed the triage vital signs and the nursing notes.  Pertinent labs & imaging results that were available during my care of the patient were reviewed by me and considered in my medical decision making (see chart for details).   After the initial evaluation I reviewed patient's chart, no substantial documentation is available other than 1 document from his primary care physician.  Subsequently discussed his case with our urology colleagues, who use a different electronic medical record system.  We reviewed the patient's CT scan from last week, which is seemingly a noncontrast CT, demonstrated chronic UPJ obstruction with hydronephrosis, but no stones. Patient started on cyclobenzaprine, referred to Ortho for possible musculoskeletal etiology, but also provided follow-up for repeat studies.  Here with consideration of infection  versus hernia versus GI process, with consideration of the patient's recently identified ureteral abnormality, the patient will have labs, CT.  Fluids, analgesia ordered.  7:52 PM Patient now committed by his wife with whom I discussed his illness, today's evaluation, his evaluation last week with urology, and results. We discussed CT reassuring, no evidence for acute intra-abdominal pathology, no bowel obstruction, no diverticulitis. Labs reassuring, vitals reassuring.  Patient continues to have some discomfort, but no evidence for distress, no hemodynamic instability, no evidence of bacteremia, sepsis, low suspicion for occult infection.  She denies any urinary symptoms, including scrotal pain, swelling, low suspicion for this as well.  With today's reassuring CT, and results discussed earlier in the day with urology, patient will follow up with urology next week as previously scheduled and with his physician for consideration of MRI for consideration of low back radicular source for his pain.  Patient amenable to this, as is his wife. Final Clinical Impression(s) / ED Diagnoses Final diagnoses:  Flank pain, acute  Right upper quadrant abdominal pain    Rx / DC Orders ED Discharge Orders         Ordered    traMADol (ULTRAM) 50 MG tablet  Every 6 hours PRN     07/31/19 1956    predniSONE (DELTASONE) 20 MG tablet  Daily with breakfast     07/31/19 1956           Gerhard Munch, MD 07/31/19 2002

## 2019-08-02 DIAGNOSIS — M5136 Other intervertebral disc degeneration, lumbar region: Secondary | ICD-10-CM | POA: Diagnosis not present

## 2019-08-02 DIAGNOSIS — Z0000 Encounter for general adult medical examination without abnormal findings: Secondary | ICD-10-CM | POA: Diagnosis not present

## 2019-08-02 DIAGNOSIS — M5431 Sciatica, right side: Secondary | ICD-10-CM | POA: Diagnosis not present

## 2019-08-02 DIAGNOSIS — M545 Low back pain: Secondary | ICD-10-CM | POA: Diagnosis not present

## 2019-08-02 DIAGNOSIS — M546 Pain in thoracic spine: Secondary | ICD-10-CM | POA: Diagnosis not present

## 2019-08-12 ENCOUNTER — Other Ambulatory Visit (HOSPITAL_COMMUNITY): Payer: Self-pay | Admitting: Urology

## 2019-08-12 DIAGNOSIS — R109 Unspecified abdominal pain: Secondary | ICD-10-CM

## 2019-08-12 DIAGNOSIS — N135 Crossing vessel and stricture of ureter without hydronephrosis: Secondary | ICD-10-CM

## 2019-08-24 ENCOUNTER — Other Ambulatory Visit: Payer: Self-pay

## 2019-08-24 ENCOUNTER — Ambulatory Visit (HOSPITAL_COMMUNITY)
Admission: RE | Admit: 2019-08-24 | Discharge: 2019-08-24 | Disposition: A | Discharge status: Home / Self Care | Payer: Medicare Other | Source: Ambulatory Visit | Attending: Urology | Admitting: Urology

## 2019-08-24 DIAGNOSIS — N135 Crossing vessel and stricture of ureter without hydronephrosis: Secondary | ICD-10-CM | POA: Diagnosis not present

## 2019-08-24 DIAGNOSIS — R109 Unspecified abdominal pain: Secondary | ICD-10-CM | POA: Diagnosis not present

## 2019-08-24 MED ORDER — FUROSEMIDE 10 MG/ML IJ SOLN
INTRAMUSCULAR | Status: AC
  Filled 2019-08-24: qty 8

## 2019-08-24 MED ORDER — FUROSEMIDE 10 MG/ML IJ SOLN: 60.0000 mg | Freq: Once | INTRAMUSCULAR | Status: DC

## 2019-08-24 MED ORDER — TECHNETIUM TC 99M MERTIATIDE
5.3000 | Freq: Once | INTRAVENOUS | Status: AC | PRN
  Administered 2019-08-24: 5.3 via INTRAVENOUS

## 2019-09-12 DIAGNOSIS — M810 Age-related osteoporosis without current pathological fracture: Secondary | ICD-10-CM | POA: Diagnosis not present

## 2019-09-12 DIAGNOSIS — Z13820 Encounter for screening for osteoporosis: Secondary | ICD-10-CM | POA: Diagnosis not present

## 2019-09-12 DIAGNOSIS — Z0389 Encounter for observation for other suspected diseases and conditions ruled out: Secondary | ICD-10-CM | POA: Diagnosis not present

## 2019-09-17 ENCOUNTER — Ambulatory Visit
Admission: EM | Admit: 2019-09-17 | Discharge: 2019-09-17 | Disposition: A | Discharge status: Home / Self Care | Payer: Medicare Other | Attending: Emergency Medicine | Admitting: Emergency Medicine

## 2019-09-17 ENCOUNTER — Other Ambulatory Visit: Payer: Self-pay

## 2019-09-17 DIAGNOSIS — L089 Local infection of the skin and subcutaneous tissue, unspecified: Secondary | ICD-10-CM | POA: Diagnosis not present

## 2019-09-17 DIAGNOSIS — W57XXXA Bitten or stung by nonvenomous insect and other nonvenomous arthropods, initial encounter: Secondary | ICD-10-CM | POA: Diagnosis not present

## 2019-09-17 DIAGNOSIS — S80862A Insect bite (nonvenomous), left lower leg, initial encounter: Secondary | ICD-10-CM

## 2019-09-17 MED ORDER — MUPIROCIN 2 % EX OINT: 1.0000 "application " | TOPICAL_OINTMENT | Freq: Two times a day (BID) | CUTANEOUS | 1 refills | Status: AC

## 2019-09-17 NOTE — Discharge Instructions (Signed)
Prescribed bactroban ointment use as directed and to completion Alternate ibuprofen and tylenol as needed for pain and fever Follow up with PCP if symptoms persists Return or go to the ED if you have any new or worsening symptoms such as increased pain, redness, swelling, discharge, high fever, night sweats, abdominal pain, etc..Marland Kitchen

## 2019-09-17 NOTE — ED Provider Notes (Signed)
[facility]   [id number] 09/17/19 Arrival Time: 1201  CC: bug bite  SUBJECTIVE:  Jonathan Matthews is a 69 y.o. male who presents with a bug bite to LT inner thigh x 1 week. Describes as increased redness.  Has tried cleaning and OTC cream with minimal relief.  Denies aggravating factors.  Denies similar symptoms in the past.   Denies fever, chills, nausea, vomiting, discharge.   ROS: As per HPI.  All other pertinent ROS negative.     No past medical history on file. Past Surgical History:  Procedure Laterality Date  . ABDOMINAL SURGERY    . KNEE SURGERY     Allergies  Allergen Reactions  . Codeine Rash  . Hydrocodone-Acetaminophen Rash   No current facility-administered medications on file prior to encounter.   Current Outpatient Medications on File Prior to Encounter  Medication Sig Dispense Refill  . aspirin 81 MG EC tablet Take 81 mg by mouth daily.    Marland Kitchen b complex vitamins capsule Take 1 capsule by mouth daily.    . calcium citrate (CALCITRATE - DOSED IN MG ELEMENTAL CALCIUM) 950 (200 Ca) MG tablet Take 200 mg of elemental calcium by mouth daily.    . metoprolol tartrate (LOPRESSOR) 25 MG tablet Take 25 mg by mouth daily.    . predniSONE (DELTASONE) 20 MG tablet Take 2 tablets (40 mg total) by mouth daily with breakfast. For the next four days 8 tablet 0  . tamsulosin (FLOMAX) 0.4 MG CAPS capsule Take 0.4 mg by mouth daily.    . traMADol (ULTRAM) 50 MG tablet Take 1 tablet (50 mg total) by mouth every 6 (six) hours as needed for severe pain. 15 tablet 0   Social History   Socioeconomic History  . Marital status: Married    Spouse name: Not on file  . Number of children: Not on file  . Years of education: Not on file  . Highest education level: Not on file  Occupational History  . Not on file  Tobacco Use  . Smoking status: Never Smoker  . Smokeless tobacco: Never Used  Substance and Sexual Activity  . Alcohol use: Never  . Drug use: Never  . Sexual  activity: Not on file  Other Topics Concern  . Not on file  Social History Narrative  . Not on file   Social Determinants of Health   Financial Resource Strain:   . Difficulty of Paying Living Expenses:   Food Insecurity:   . Worried About Programme researcher, broadcasting/film/video in the Last Year:   . Barista in the Last Year:   Transportation Needs:   . Freight forwarder (Medical):   Marland Kitchen Lack of Transportation (Non-Medical):   Physical Activity:   . Days of Exercise per Week:   . Minutes of Exercise per Session:   Stress:   . Feeling of Stress :   Social Connections:   . Frequency of Communication with Friends and Family:   . Frequency of Social Gatherings with Friends and Family:   . Attends Religious Services:   . Active Member of Clubs or Organizations:   . Attends Banker Meetings:   Marland Kitchen Marital Status:   Intimate Partner Violence:   . Fear of Current or Ex-Partner:   . Emotionally Abused:   Marland Kitchen Physically Abused:   . Sexually Abused:    No family history on file.  OBJECTIVE: Vitals:   09/17/19 1210  BP: 131/87  Pulse: 73  Resp:  18  Temp: (!) 97.4 F (36.3 C)  SpO2: 94%    General appearance: alert; no distress Head: NCAT Lungs: normal respiratory effort Skin: warm and dry; small area of erythema to distal upper thigh, medial aspect with apx 1 cm in diameter, scab formation and peeling evident, NTTP, no obvious drainage or bleeding Psychological: alert and cooperative; normal mood and affect  ASSESSMENT & PLAN:  1. Infected insect bite of left lower extremity, initial encounter     Meds ordered this encounter  Medications  . mupirocin ointment (BACTROBAN) 2 %    Sig: Apply 1 application topically 2 (two) times daily.    Dispense:  30 g    Refill:  1    Order Specific Question:   Supervising Provider    Answer:   Raylene Everts [[id number]]   Prescribed bactroban ointment use as directed and to completion Alternate ibuprofen and tylenol as needed  for pain and fever Follow up with PCP if symptoms persists Return or go to the ED if you have any new or worsening symptoms such as increased pain, redness, swelling, discharge, high fever, night sweats, abdominal pain, etc...   Reviewed expectations re: course of current medical issues. Questions answered. Outlined signs and symptoms indicating need for more acute intervention. Patient verbalized understanding. After Visit Summary given.   Lestine Box, PA-C 09/17/19 1220

## 2019-09-17 NOTE — ED Triage Notes (Signed)
Pt presents with insect bite on left leg for past week

## 2019-12-01 DIAGNOSIS — G629 Polyneuropathy, unspecified: Secondary | ICD-10-CM | POA: Diagnosis not present

## 2019-12-01 DIAGNOSIS — M5431 Sciatica, right side: Secondary | ICD-10-CM | POA: Diagnosis not present

## 2019-12-01 DIAGNOSIS — I10 Essential (primary) hypertension: Secondary | ICD-10-CM | POA: Diagnosis not present

## 2019-12-20 DIAGNOSIS — M545 Low back pain: Secondary | ICD-10-CM | POA: Diagnosis not present

## 2020-02-01 NOTE — Progress Notes (Signed)
[facility]    Provider:  Dr Jaynee Eagles Requesting Provider: Neale Burly, MD Primary Care Provider:  Neale Burly, MD  CC:  polyneuropathy  HPI:  Jonathan Matthews is a 69 y.o. male here as requested by Neale Burly, MD for polyneuropathy. PMHx obesity, hyperlipidemia, myocardial infarction, bowel resection, heart stent, lithotripsy, essential hypertension, sciatica of the right side, ckd.  I reviewed Dr. Joselyn Arrow notes which had no information pertinent to the requesting symptom: Patient presented for follow-up of hypertension, he denied headaches, dizziness, blurry vision, palpitations also been having a lot of nonhealing bug bites.  There was nothing about polyneuropathy in the notes I received from Dr. Sherrie Sport.  I reviewed epic and "care everywhere" notes without finding any significant information on his polyneuropathy.  There was a note from October 05, 2018 from orthopedics where patient was seen for closed fracture of the distal ends of the right radius and ulna with routine healing after falling on an outstretched hand and he also reported intermittent pain to the [facility] joint of the right thumb due to pre-existing [facility] arthritis.  Symptoms are in the toes and 1/2 bottom of the feet. His wife provides much information as well. Feels like a glove. Started several years ago. Started with tingling. He is having swelling in the feet. He saw a foot doctor who told him he had neuropathy by a skin biopsy. Gabapentin did not help. Doesn't hurt him to walk. At night time he has a hard time sleeping because it is swollen. Not painful. Wife doesn't see swelling but he states it feels swollen. ALl the time, constant, nothing makes it better or worse. He is having non healing bug bites. He has numbness. Doesn't feel the TENS unit on his feet. Symmetrical, mostly below the ankle, no pain, no weakness, no significant back pain and no radicular symptoms. Hand are fine. No hx of neuropathy in  the family or any neurologic disease, no autoimmune disorders in the family. No history of medications that can cause this. 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 No precipitating events. Started just as a tingling in his toes and slowly progressing. Hardly ever alcohol, no smoking, no drugs. Some imbalance. No falls. No other focal neurologic deficits, associated symptoms, inciting events or modifiable factors.  Reviewed notes, labs and imaging from outside physicians, which showed: see above   Review of Systems: Patient complains of symptoms per HPI as well as the following symptoms: neuropathy. Pertinent negatives and positives per HPI. All others negative.   Social History   Socioeconomic History  . Marital status: Married    Spouse name: Jonathan Matthews  . Number of children: 1  . Years of education: Not on file  . Highest education level: Not on file  Occupational History  . Not on file  Tobacco Use  . Smoking status: Never Smoker  . Smokeless tobacco: Never Used  Substance and Sexual Activity  . Alcohol use: Never  . Drug use: Never  . Sexual activity: Not on file  Other Topics Concern  . Not on file  Social History Narrative   Lives with wife   Caffeine yes   Social Determinants of Health   Financial Resource Strain:   . Difficulty of Paying Living Expenses: Not on file  Food Insecurity:   . Worried About Charity fundraiser in the Last Year: Not on file  .  Ran Out of Food in the Last Year: Not on file  Transportation Needs:   . Lack of Transportation (Medical): Not on file  . Lack of Transportation (Non-Medical): Not on file  Physical Activity:   . Days of Exercise per Week: Not on file  . Minutes of Exercise per Session: Not on file  Stress:   . Feeling of Stress : Not on file  Social Connections:   . Frequency of Communication with Friends and Family: Not on file  .  Frequency of Social Gatherings with Friends and Family: Not on file  . Attends Religious Services: Not on file  . Active Member of Clubs or Organizations: Not on file  . Attends Archivist Meetings: Not on file  . Marital Status: Not on file  Intimate Partner Violence:   . Fear of Current or Ex-Partner: Not on file  . Emotionally Abused: Not on file  . Physically Abused: Not on file  . Sexually Abused: Not on file    Family History  Problem Relation Age of Onset  . Neuropathy Neg Hx     Past Medical History:  Diagnosis Date  . BPH (benign prostatic hyperplasia)   . Hypertension   . Polyneuropathy   . Sciatica of right side     Patient Active Problem List   Diagnosis Date Noted  . Polyneuropathy 02/07/2020    Past Surgical History:  Procedure Laterality Date  . ABDOMINAL SURGERY    . KNEE SURGERY Left    arthroscopy  . LITHOTRIPSY    . NEPHROSTOMY    . SHOULDER SURGERY Right     Current Outpatient Medications  Medication Sig Dispense Refill  . aspirin 81 MG EC tablet Take 81 mg by mouth daily.    Marland Kitchen b complex vitamins capsule Take 1 capsule by mouth daily.    . calcium citrate (CALCITRATE - DOSED IN MG ELEMENTAL CALCIUM) 950 (200 Ca) MG tablet Take 200 mg of elemental calcium by mouth daily.    . mupirocin ointment (BACTROBAN) 2 % Apply 1 application topically 2 (two) times daily. 30 g 1  . tamsulosin (FLOMAX) 0.4 MG CAPS capsule Take 0.4 mg by mouth daily.     No current facility-administered medications for this visit.    Allergies as of 02/03/2020 - Review Complete 02/03/2020  Allergen Reaction Noted  . Codeine Rash 06/22/2018  . Hydrocodone-acetaminophen Rash 06/22/2018  . Gabapentin  02/03/2020    Vitals: BP (!) 136/93   Pulse 75   Ht '6\' 1"'  (1.854 m)   Wt 276 lb (125.2 kg)   BMI 36.41 kg/m  Last Weight:  Wt Readings from Last 1 Encounters:  02/03/20 276 lb (125.2 kg)   Last Height:   Ht Readings from Last 1 Encounters:  02/03/20 6'  1" (1.854 m)     Physical exam: Exam: Gen: NAD, conversant, well nourised, obese, well groomed                     CV: RRR, no MRG. No Carotid Bruits. Trace peripheral edema distal legs, warm, nontender Eyes: Conjunctivae clear without exudates or hemorrhage  Neuro: Detailed Neurologic Exam  Speech:    Speech is normal; fluent and spontaneous with normal comprehension.  Cognition:    The patient is oriented to person, place, and time;     recent and remote memory intact;     language fluent;     normal attention, concentration,     fund of knowledge Cranial  Nerves:    The pupils are equal, round, and reactive to light. Attempted fundoscopy could not visualize.. Visual fields are full to finger confrontation. Extraocular movements are intact. Trigeminal sensation is intact and the muscles of mastication are normal. The face is symmetric. The palate elevates in the midline. Hearing intact. Voice is normal. Shoulder shrug is normal. The tongue has normal motion without fasciculations.   Coordination:    No dysmetria or ataxia  Gait:    Not ataxic  Motor Observation:    No asymmetry, no atrophy, and no involuntary movements noted. Tone:    Normal muscle tone.    Posture:    Posture is normal. normal erect    Strength:    Strength is V/V in the upper and lower limbs.      Sensation: decreased pin prick and temp to below the knee, vibration absent at great toe and 6 sec at led malleolus,      Reflex Exam:  DTR's: Absent AJs, 1+ patellars and biceps     Toes:    The toes are downgoing bilaterally.   Clonus:    Clonus is absent.    Assessment/Plan:  69 y.o. male here as requested by Neale Burly, MD for polyneuropathy. PMHx obesity, hyperlipidemia, myocardial infarction, bowel resection, heart stent, lithotripsy, essential hypertension, sciatica of the right side, ckd.  I reviewed Dr. Joselyn Arrow notes which had no significant information pertinent to the presenting  problem that I could find; In the future would be helpful for referring office to send notes with some history of referring problem and any prior blood work completed in pcp office. I had a long discussion with patient and wife on the causes of peipheral neuropathy, will send extensive testing and have him back for emg/ncs. Also discussed genetic testing if needed.  - emg/ncs  Orders Placed This Encounter  Procedures  . Hemoglobin A1c  . B12 and Folate Panel  . Methylmalonic acid, serum  . Vitamin B1  . Vitamin B6  . TSH  . RPR  . Sjogren's syndrome antibods(ssa + ssb)  . Hepatitis C antibody  . Rheumatoid factor  . Heavy metals, blood  . Multiple Myeloma Panel (SPEP&IFE w/QIG)  . CBC  . Comprehensive metabolic panel  . B. burgdorfi Antibody  . Tissue transglutaminase, IgA  . Gliadin antibodies, serum  . ANA, IFA (with reflex)  . NCV with EMG(electromyography)   No orders of the defined types were placed in this encounter.   Cc: Neale Burly, MD  Sarina Ill, MD  [facility] [address] [place], [place] [address]  Phone [phone] Fax [phone]

## 2020-02-02 ENCOUNTER — Encounter: Payer: Self-pay | Admitting: *Deleted

## 2020-02-03 ENCOUNTER — Other Ambulatory Visit: Payer: Self-pay

## 2020-02-03 ENCOUNTER — Ambulatory Visit: Payer: Medicare Other | Admitting: Neurology

## 2020-02-03 ENCOUNTER — Encounter: Payer: Self-pay | Admitting: Neurology

## 2020-02-03 VITALS — BP 136/93 | HR 75 | Ht 73.0 in | Wt 276.0 lb

## 2020-02-03 DIAGNOSIS — R7879 Finding of abnormal level of heavy metals in blood: Secondary | ICD-10-CM | POA: Diagnosis not present

## 2020-02-03 DIAGNOSIS — G629 Polyneuropathy, unspecified: Secondary | ICD-10-CM

## 2020-02-03 DIAGNOSIS — Z131 Encounter for screening for diabetes mellitus: Secondary | ICD-10-CM | POA: Diagnosis not present

## 2020-02-03 DIAGNOSIS — E531 Pyridoxine deficiency: Secondary | ICD-10-CM | POA: Diagnosis not present

## 2020-02-03 DIAGNOSIS — E538 Deficiency of other specified B group vitamins: Secondary | ICD-10-CM | POA: Diagnosis not present

## 2020-02-03 DIAGNOSIS — E519 Thiamine deficiency, unspecified: Secondary | ICD-10-CM | POA: Diagnosis not present

## 2020-02-03 NOTE — Patient Instructions (Signed)

## 2020-02-07 ENCOUNTER — Encounter: Payer: Self-pay | Admitting: Neurology

## 2020-02-07 DIAGNOSIS — G629 Polyneuropathy, unspecified: Secondary | ICD-10-CM | POA: Insufficient documentation

## 2020-02-08 ENCOUNTER — Telehealth: Payer: Self-pay | Admitting: *Deleted

## 2020-02-08 NOTE — Telephone Encounter (Signed)
-----   Message from Anson Fret, MD sent at 02/07/2020  5:31 PM EDT ----- So far patient is pre-diabetic. Unfortunately even pre-diabetes can cause neropathy in the feet over time. I would follow up with primary care about it and be treated and I will review at his emg/ncs thanks.

## 2020-02-08 NOTE — Telephone Encounter (Signed)
I spoke with pt's wife Misty Stanley (on [place]) and advised the lab results so far have shown the patient is pre-diabetic and even pre-diabetes over time can cause neuropathy in the feet. We discussed that Dr. Lucia Gaskins advises pt follow up with primary care for treatment and we will discuss in more detail at pt's EMG/NCS on 03/01/20. Pt's wife's questions were answered. I told her we would call if the other labs were abnormal, otherwise can discuss at EMG. She verbalized appreciation for the call.

## 2020-02-09 LAB — RPR: RPR Ser Ql: NONREACTIVE

## 2020-02-09 LAB — SJOGREN'S SYNDROME ANTIBODS(SSA + SSB)
ENA SSA (RO) Ab: <0.2 AI (ref 0.0–0.9)
ENA SSB (LA) Ab: <0.2 AI (ref 0.0–0.9)

## 2020-02-09 LAB — COMPREHENSIVE METABOLIC PANEL
ALT: 33 IU/L (ref 0–44)
AST: 21 IU/L (ref 0–40)
Albumin/Globulin Ratio: 1.6 (ref 1.2–2.2)
Albumin: 4.2 g/dL (ref 3.8–4.8)
Alkaline Phosphatase: 61 IU/L (ref 44–121)
BUN/Creatinine Ratio: 12 (ref 10–24)
BUN: 14 mg/dL (ref 8–27)
Bilirubin Total: 0.7 mg/dL (ref 0.0–1.2)
CO2: 26 mmol/L (ref 20–29)
Calcium: 9.5 mg/dL (ref 8.6–10.2)
Chloride: 100 mmol/L (ref 96–106)
Creatinine, Ser: 1.17 mg/dL (ref 0.76–1.27)
GFR calc Af Amer: 73 mL/min/{1.73_m2} (ref >59)
GFR calc non Af Amer: 63 mL/min/{1.73_m2} (ref >59)
Globulin, Total: 2.6 g/dL (ref 1.5–4.5)
Glucose: 91 mg/dL (ref 65–99)
Potassium: 4.5 mmol/L (ref 3.5–5.2)
Sodium: 139 mmol/L (ref 134–144)
Total Protein: 6.8 g/dL (ref 6.0–8.5)

## 2020-02-09 LAB — TSH: TSH: 2.44 u[IU]/mL (ref 0.450–4.500)

## 2020-02-09 LAB — MULTIPLE MYELOMA PANEL, SERUM
Albumin SerPl Elph-Mcnc: 3.5 g/dL (ref 2.9–4.4)
Albumin/Glob SerPl: 1.1 (ref 0.7–1.7)
Alpha 1: 0.2 g/dL (ref 0.0–0.4)
Alpha2 Glob SerPl Elph-Mcnc: 0.6 g/dL (ref 0.4–1.0)
B-Globulin SerPl Elph-Mcnc: 1.2 g/dL (ref 0.7–1.3)
Gamma Glob SerPl Elph-Mcnc: 1.3 g/dL (ref 0.4–1.8)
Globulin, Total: 3.3 g/dL (ref 2.2–3.9)
IgA/Immunoglobulin A, Serum: 381 mg/dL (ref 61–437)
IgG (Immunoglobin G), Serum: 1438 mg/dL (ref 603–1613)
IgM (Immunoglobulin M), Srm: 27 mg/dL (ref 20–172)

## 2020-02-09 LAB — METHYLMALONIC ACID, SERUM: Methylmalonic Acid: 243 nmol/L (ref 0–378)

## 2020-02-09 LAB — CBC
Hematocrit: 45.3 % (ref 37.5–51.0)
Hemoglobin: 14.9 g/dL (ref 13.0–17.7)
MCH: 29.1 pg (ref 26.6–33.0)
MCHC: 32.9 g/dL (ref 31.5–35.7)
MCV: 89 fL (ref 79–97)
Platelets: 197 10*3/uL (ref 150–450)
RBC: 5.12 x10E6/uL (ref 4.14–5.80)
RDW: 14.3 % (ref 11.6–15.4)
WBC: 5.9 10*3/uL (ref 3.4–10.8)

## 2020-02-09 LAB — HEAVY METALS, BLOOD
Arsenic: <1 ug/L — ABNORMAL LOW (ref 2–23)
Lead, Blood: <1 ug/dL (ref 0–4)
Mercury: 1 ug/L (ref 0.0–14.9)

## 2020-02-09 LAB — GLIADIN ANTIBODIES, SERUM
Antigliadin Abs, IgA: 4 units (ref 0–19)
Gliadin IgG: 2 units (ref 0–19)

## 2020-02-09 LAB — TISSUE TRANSGLUTAMINASE, IGA: Transglutaminase IgA: <2 U/mL (ref 0–3)

## 2020-02-09 LAB — HEPATITIS C ANTIBODY: Hep C Virus Ab: <0.1 s/co ratio (ref 0.0–0.9)

## 2020-02-09 LAB — HEMOGLOBIN A1C
Est. average glucose Bld gHb Est-mCnc: 128 mg/dL
Hgb A1c MFr Bld: 6.1 % — ABNORMAL HIGH (ref 4.8–5.6)

## 2020-02-09 LAB — B12 AND FOLATE PANEL
Folate: >20 ng/mL (ref >3.0)
Vitamin B-12: 582 pg/mL (ref 232–1245)

## 2020-02-09 LAB — VITAMIN B6: Vitamin B6: 32.3 ug/L (ref 5.3–46.7)

## 2020-02-09 LAB — ANTINUCLEAR ANTIBODIES, IFA: ANA Titer 1: NEGATIVE

## 2020-02-09 LAB — VITAMIN B1: Thiamine: 194.8 nmol/L (ref 66.5–200.0)

## 2020-02-09 LAB — B. BURGDORFI ANTIBODIES: Lyme IgG/IgM Ab: <0.91 {ISR} (ref 0.00–0.90)

## 2020-02-09 LAB — RHEUMATOID FACTOR: Rheumatoid fact SerPl-aCnc: 10 IU/mL (ref 0.0–13.9)

## 2020-03-01 ENCOUNTER — Ambulatory Visit: Payer: Medicare Other | Admitting: Neurology

## 2020-03-01 ENCOUNTER — Other Ambulatory Visit: Payer: Self-pay

## 2020-03-01 ENCOUNTER — Ambulatory Visit (INDEPENDENT_AMBULATORY_CARE_PROVIDER_SITE_OTHER): Payer: Medicare Other | Admitting: Neurology

## 2020-03-01 DIAGNOSIS — G609 Hereditary and idiopathic neuropathy, unspecified: Secondary | ICD-10-CM | POA: Diagnosis not present

## 2020-03-01 DIAGNOSIS — G629 Polyneuropathy, unspecified: Secondary | ICD-10-CM

## 2020-03-01 DIAGNOSIS — Z0289 Encounter for other administrative examinations: Secondary | ICD-10-CM

## 2020-03-01 NOTE — Progress Notes (Signed)
History: Patient reports sensory changes distally in his legs more so the toes and half bottom of the feet, feels like a glove, started several years ago, tingling and numbness and also having some swelling in the feet.  Not painful but feels swollen all the time.  No weakness.  No significant back pain or radicular symptoms.  No history of neuropathy in the family or autoimmune disorders.  I discussed with patient and wife today that his neuropathy is predominantly sensory with some changes in the motor nerves.  EMG needle study of the muscles was within normal limits.  Unfortunately this is idiopathic as we performed extensive lab work and could not find any etiology.  Labs included ANA, gliadin antibodies, tissue transglutaminase antibodies, Lyme, CMP, CBC, IFE with SPEP, heavy metals in the blood, rheumatoid factor, hep C, Sjogren's, RPR, TSH, vitamin B6, vitamin B1, vitamin B12, methylmalonic acid and hemoglobin A1c.  Hemoglobin A1c was 6.1 which can be a risk factor for peripheral polyneuropathy along with obesity.  Otherwise extensive blood work as stated above was normal.  We discussed idiopathic peripheral neuropathy.  I also offered him a free [employer] genetic panel to see if he had any genetic causes for his symptoms.  I cannot give him any answers, but he appears to be stable, discussed if it worsens we can always send him to [facility] neurology for another pair of eyes.  We discussed treatment, he has not tolerated gabapentin in some others we will try a topical compounded cream with gabapentin, amitriptyline and other ingredients for topical use.  We will order using a specialty form.  I spent 20 minutes of face-to-face and non-face-to-face time with patient on the  1. Polyneuropathy   2. Chronic idiopathic axonal polyneuropathy    diagnosis.  This included previsit chart review, lab review, study review, order entry, electronic health record documentation, patient education on the different  diagnostic and therapeutic options, counseling and coordination of care, risks and benefits of management, compliance, or risk factor reduction. This does not include time spent on emg/ncs.

## 2020-03-05 ENCOUNTER — Telehealth: Payer: Self-pay | Admitting: *Deleted

## 2020-03-05 NOTE — Progress Notes (Signed)
See procedure note.

## 2020-03-05 NOTE — Telephone Encounter (Signed)
Per Dr Lucia Gaskins, [facility] compounded cream order form completed for general neuropathy compound. Order form signed by MD and faxed to [facility]. Received a receipt of confirmation. Also mailed pt a sheet that has [facility] pharmacy information.

## 2020-03-05 NOTE — Progress Notes (Signed)
Full Name: Jonathan Matthews Gender: Male MRN #: [id number] Date of Birth: [birth year]    Visit Date: 03/01/2020 09:49 Age: 69 Years Examining Physician: Naomie Dean, MD  Referring Physician: Toma Deiters, MD Height: 6 feet 1 inch Weight: 276lbs    History: Patient with sensory changes in his feet  Summary:  EMG/NCS performed on the bilateral lower extremities.  The left peroneal motor nerve showed decreased conduction velocity (fibular head to ankle, 40 m/s, normal greater than 44) and decreased conduction velocity (popliteal fossa to fibular head, 40 m/s, normal greater than 44).  The right peroneal motor nerve showed decreased conduction velocity (fibular head to ankle, 37 m/s, normal greater than 44) and decreased conduction velocy (popliteal fossa to fibular head, 36 m/s, normal greater than 44).  The left tibial motor nerve showed reduced amplitude (1.6 mV, normal greater than 4) and decreased conduction velocity (pop fossa to ankle, 39 m/s, normal greater than 41). The right tibial motor nerve showed reduced amplitude (1.5 mV, normal greater than 4) and decreased conduction velocity (pop fossa to ankle, 35 m/s, normal greater than 41).  The left sural sensory nerve showed reduced amplitude (2 V, normal greater than 6).  The right sural sensory nerve showed reduced amplitude (2 V, normal greater than 6).  The left superficial peroneal sensory nerve and the right superficial peroneal sensory nerve showed no response.  The left tibial F wave showed delayed latency (69.68ms, normal less than 56) and the right tibial F wave showed delayed latency (69.6 ms, normal less than 56).   All remaining nerves (as indicated in the following tables) were within normal limits.  All muscles (as indicated in the following tables) were within normal limits.    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    Naomie Dean, M.D.  [facility] [address], [address] [place], [place] [address] Tel: [phone] Fax: [phone]  Verbal informed consent was obtained from the patient, patient was informed of potential risk of procedure, including bruising, bleeding, hematoma formation, infection, muscle weakness, muscle pain, numbness, among others.        MNC    Nerve / Sites Muscle Latency Ref. Amplitude Ref. Rel Amp Segments Distance Velocity Ref. Area    ms ms mV mV %  cm m/s m/s mVms  L Peroneal - EDB     Ankle EDB 4.7 ?6.5 2.3 ?2.0 100 Ankle - EDB 9   8.5     Fib head EDB 12.9  1.8  78.4 Fib head - Ankle 33 40 ?44 12.5     Pop fossa EDB 15.4  1.7  96 Pop fossa - Fib head 10 40 ?44 10.9         Pop fossa - Ankle      R Peroneal - EDB     Ankle EDB 4.6 ?6.5 2.5 ?2.0 100 Ankle - EDB 9   10.4     Fib head EDB 13.3  2.2  90.5 Fib head - Ankle 32 37 ?44 10.7     Pop fossa EDB 16.1  2.2  99 Pop fossa - Fib head 10 36 ?44 9.9         Pop fossa - Ankle      L Tibial - AH     Ankle AH 4.9 ?5.8 1.6 ?4.0 100 Ankle - AH 9   5.1     Pop fossa AH 15.4  1.0  62.4 Pop fossa - Ankle 41 39 ?41 2.1  °R Tibial - AH  °   Ankle AH 4.2 ?5.8 1.5 ?4.0 100 Ankle - AH 9   5.3  °   Pop fossa AH 16.1  0.7  50.5 Pop fossa - Ankle 42 35 ?41 0.8  °           °SNC °   °Nerve / Sites Rec. Site Peak Lat Ref.  Amp Ref. Segments Distance  °  ms ms µV µV  cm  °L Sural - Ankle (Calf)  °   Calf Ankle 4.4 ?4.4 2 ?6 Calf - Ankle 14  °R Sural - Ankle (Calf)  °   Calf Ankle 4.3 ?4.4 2 ?6 Calf - Ankle 14  °L Superficial peroneal - Ankle  °   Lat leg Ankle NR ?4.4 NR ?6 Lat leg - Ankle 14  °R Superficial peroneal - Ankle  °   Lat leg Ankle NR ?4.4 NR ?6 Lat leg - Ankle 14  °           °F  Wave °   °Nerve F Lat Ref.  ° ms ms  °L Tibial - AH 69.4 ?56.0  °R Tibial - AH 69.6 ?56.0  °       °EMG Summary Table   ° Spontaneous MUAP Recruitment  °Muscle IA Fib PSW Fasc Other Amp Dur. Poly Pattern  °R. Vastus medialis Normal None None None _______ Normal Normal  Normal Normal  °R. Tibialis anterior Normal None None None _______ Normal Normal Normal Normal  °R. Gastrocnemius (Medial head) Normal None None None _______ Normal Normal Normal Normal  °R. Extensor hallucis longus Normal None None None _______ Normal Normal Normal Normal  °R. Abductor hallucis Normal None None None _______ Normal Normal Normal Normal  ° °  ° °

## 2020-03-05 NOTE — Procedures (Signed)
Full Name: Jonathan Matthews Gender: Male MRN #: [id number] Date of Birth: [birth year]    Visit Date: 03/01/2020 09:49 Age: 69 Years Examining Physician: Naomie Dean, MD  Referring Physician: Toma Deiters, MD Height: 6 feet 1 inch Weight: 276lbs    History: Patient with sensory changes in his feet  Summary:  EMG/NCS performed on the bilateral lower extremities.  The left peroneal motor nerve showed decreased conduction velocity (fibular head to ankle, 40 m/s, normal greater than 44) and decreased conduction velocity (popliteal fossa to fibular head, 40 m/s, normal greater than 44).  The right peroneal motor nerve showed decreased conduction velocity (fibular head to ankle, 37 m/s, normal greater than 44) and decreased conduction velocy (popliteal fossa to fibular head, 36 m/s, normal greater than 44).  The left tibial motor nerve showed reduced amplitude (1.6 mV, normal greater than 4) and decreased conduction velocity (pop fossa to ankle, 39 m/s, normal greater than 41). The right tibial motor nerve showed reduced amplitude (1.5 mV, normal greater than 4) and decreased conduction velocity (pop fossa to ankle, 35 m/s, normal greater than 41).  The left sural sensory nerve showed reduced amplitude (2 V, normal greater than 6).  The right sural sensory nerve showed reduced amplitude (2 V, normal greater than 6).  The left superficial peroneal sensory nerve and the right superficial peroneal sensory nerve showed no response.  The left tibial F wave showed delayed latency (69.68ms, normal less than 56) and the right tibial F wave showed delayed latency (69.6 ms, normal less than 56).   All remaining nerves (as indicated in the following tables) were within normal limits.  All muscles (as indicated in the following tables) were within normal limits.    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    Naomie Dean, M.D.  [facility] [address], [address] [place], [place] [address] Tel: [phone] Fax: [phone]  Verbal informed consent was obtained from the patient, patient was informed of potential risk of procedure, including bruising, bleeding, hematoma formation, infection, muscle weakness, muscle pain, numbness, among others.        MNC    Nerve / Sites Muscle Latency Ref. Amplitude Ref. Rel Amp Segments Distance Velocity Ref. Area    ms ms mV mV %  cm m/s m/s mVms  L Peroneal - EDB     Ankle EDB 4.7 ?6.5 2.3 ?2.0 100 Ankle - EDB 9   8.5     Fib head EDB 12.9  1.8  78.4 Fib head - Ankle 33 40 ?44 12.5     Pop fossa EDB 15.4  1.7  96 Pop fossa - Fib head 10 40 ?44 10.9         Pop fossa - Ankle      R Peroneal - EDB     Ankle EDB 4.6 ?6.5 2.5 ?2.0 100 Ankle - EDB 9   10.4     Fib head EDB 13.3  2.2  90.5 Fib head - Ankle 32 37 ?44 10.7     Pop fossa EDB 16.1  2.2  99 Pop fossa - Fib head 10 36 ?44 9.9         Pop fossa - Ankle      L Tibial - AH     Ankle AH 4.9 ?5.8 1.6 ?4.0 100 Ankle - AH 9   5.1     Pop fossa AH 15.4  1.0  62.4 Pop fossa - Ankle 41 39 ?41 2.1  R Tibial - AH     Ankle AH 4.2 ?5.8 1.5 ?4.0 100 Ankle - AH 9   5.3     Pop fossa AH 16.1  0.7  50.5 Pop fossa - Ankle 42 35 ?41 0.8             SNC    Nerve / Sites Rec. Site Peak Lat Ref.  Amp Ref. Segments Distance    ms ms V V  cm  L Sural - Ankle (Calf)     Calf Ankle 4.4 ?4.4 2 ?6 Calf - Ankle 14  R Sural - Ankle (Calf)     Calf Ankle 4.3 ?4.4 2 ?6 Calf - Ankle 14  L Superficial peroneal - Ankle     Lat leg Ankle NR ?4.4 NR ?6 Lat leg - Ankle 14  R Superficial peroneal - Ankle     Lat leg Ankle NR ?4.4 NR ?6 Lat leg - Ankle 14             F  Wave    Nerve F Lat Ref.   ms ms  L Tibial - AH 69.4 ?56.0  R Tibial - AH 69.6 ?56.0         EMG Summary Table    Spontaneous MUAP Recruitment  Muscle IA Fib PSW Fasc Other Amp Dur. Poly Pattern  R. Vastus medialis Normal None None None _______ Normal Normal  Normal Normal  R. Tibialis anterior Normal None None None _______ Normal Normal Normal Normal  R. Gastrocnemius (Medial head) Normal None None None _______ Normal Normal Normal Normal  R. Extensor hallucis longus Normal None None None _______ Normal Normal Normal Normal  R. Abductor hallucis Normal None None None _______ Normal Normal Normal Normal

## 2020-03-08 DIAGNOSIS — I209 Angina pectoris, unspecified: Secondary | ICD-10-CM | POA: Diagnosis not present

## 2020-03-08 DIAGNOSIS — R079 Chest pain, unspecified: Secondary | ICD-10-CM | POA: Diagnosis not present

## 2020-03-08 DIAGNOSIS — J929 Pleural plaque without asbestos: Secondary | ICD-10-CM | POA: Diagnosis not present

## 2020-03-08 DIAGNOSIS — Z7982 Long term (current) use of aspirin: Secondary | ICD-10-CM | POA: Diagnosis not present

## 2020-03-08 DIAGNOSIS — G8929 Other chronic pain: Secondary | ICD-10-CM | POA: Diagnosis not present

## 2020-03-08 DIAGNOSIS — I252 Old myocardial infarction: Secondary | ICD-10-CM | POA: Diagnosis not present

## 2020-03-08 DIAGNOSIS — R0781 Pleurodynia: Secondary | ICD-10-CM | POA: Diagnosis not present

## 2020-03-08 DIAGNOSIS — J479 Bronchiectasis, uncomplicated: Secondary | ICD-10-CM | POA: Diagnosis not present

## 2020-03-08 DIAGNOSIS — Z885 Allergy status to narcotic agent status: Secondary | ICD-10-CM | POA: Diagnosis not present

## 2020-03-08 DIAGNOSIS — Z79899 Other long term (current) drug therapy: Secondary | ICD-10-CM | POA: Diagnosis not present

## 2020-03-08 DIAGNOSIS — I700 Atherosclerosis of aorta: Secondary | ICD-10-CM | POA: Diagnosis not present

## 2020-03-08 DIAGNOSIS — I2510 Atherosclerotic heart disease of native coronary artery without angina pectoris: Secondary | ICD-10-CM | POA: Diagnosis not present

## 2020-03-08 DIAGNOSIS — R9431 Abnormal electrocardiogram [ECG] [EKG]: Secondary | ICD-10-CM | POA: Diagnosis not present

## 2020-03-08 DIAGNOSIS — E785 Hyperlipidemia, unspecified: Secondary | ICD-10-CM | POA: Diagnosis not present

## 2020-03-08 DIAGNOSIS — I10 Essential (primary) hypertension: Secondary | ICD-10-CM | POA: Diagnosis not present

## 2020-03-08 DIAGNOSIS — G629 Polyneuropathy, unspecified: Secondary | ICD-10-CM | POA: Diagnosis not present

## 2020-03-08 DIAGNOSIS — R0602 Shortness of breath: Secondary | ICD-10-CM | POA: Diagnosis not present

## 2020-03-08 DIAGNOSIS — R262 Difficulty in walking, not elsewhere classified: Secondary | ICD-10-CM | POA: Diagnosis not present

## 2020-03-09 DIAGNOSIS — I5189 Other ill-defined heart diseases: Secondary | ICD-10-CM | POA: Diagnosis not present

## 2020-03-09 DIAGNOSIS — I517 Cardiomegaly: Secondary | ICD-10-CM | POA: Diagnosis not present

## 2020-03-09 DIAGNOSIS — R079 Chest pain, unspecified: Secondary | ICD-10-CM | POA: Diagnosis not present

## 2020-03-14 DIAGNOSIS — I209 Angina pectoris, unspecified: Secondary | ICD-10-CM | POA: Diagnosis not present

## 2020-03-14 DIAGNOSIS — K5900 Constipation, unspecified: Secondary | ICD-10-CM | POA: Diagnosis not present

## 2020-04-26 DIAGNOSIS — R0602 Shortness of breath: Secondary | ICD-10-CM | POA: Diagnosis not present

## 2020-04-26 DIAGNOSIS — K5900 Constipation, unspecified: Secondary | ICD-10-CM | POA: Diagnosis not present

## 2020-04-26 DIAGNOSIS — I10 Essential (primary) hypertension: Secondary | ICD-10-CM | POA: Diagnosis not present

## 2020-04-26 DIAGNOSIS — N182 Chronic kidney disease, stage 2 (mild): Secondary | ICD-10-CM | POA: Diagnosis not present

## 2020-04-26 DIAGNOSIS — R7303 Prediabetes: Secondary | ICD-10-CM | POA: Diagnosis not present

## 2020-04-26 DIAGNOSIS — I209 Angina pectoris, unspecified: Secondary | ICD-10-CM | POA: Diagnosis not present

## 2020-04-26 DIAGNOSIS — Z0000 Encounter for general adult medical examination without abnormal findings: Secondary | ICD-10-CM | POA: Diagnosis not present

## 2020-04-30 DIAGNOSIS — I25118 Atherosclerotic heart disease of native coronary artery with other forms of angina pectoris: Secondary | ICD-10-CM | POA: Diagnosis not present

## 2020-04-30 DIAGNOSIS — E785 Hyperlipidemia, unspecified: Secondary | ICD-10-CM | POA: Diagnosis not present

## 2020-04-30 DIAGNOSIS — I10 Essential (primary) hypertension: Secondary | ICD-10-CM | POA: Diagnosis not present

## 2020-08-03 DIAGNOSIS — J40 Bronchitis, not specified as acute or chronic: Secondary | ICD-10-CM | POA: Diagnosis not present

## 2020-08-12 DIAGNOSIS — J1282 Pneumonia due to coronavirus disease 2019: Secondary | ICD-10-CM | POA: Diagnosis not present

## 2020-08-12 DIAGNOSIS — R079 Chest pain, unspecified: Secondary | ICD-10-CM | POA: Diagnosis not present

## 2020-08-12 DIAGNOSIS — J9811 Atelectasis: Secondary | ICD-10-CM | POA: Diagnosis not present

## 2020-08-12 DIAGNOSIS — U071 COVID-19: Secondary | ICD-10-CM | POA: Diagnosis not present

## 2020-08-12 DIAGNOSIS — Z955 Presence of coronary angioplasty implant and graft: Secondary | ICD-10-CM | POA: Diagnosis not present

## 2020-08-18 DIAGNOSIS — I10 Essential (primary) hypertension: Secondary | ICD-10-CM | POA: Diagnosis not present

## 2020-08-18 DIAGNOSIS — N182 Chronic kidney disease, stage 2 (mild): Secondary | ICD-10-CM | POA: Diagnosis not present

## 2020-09-05 DIAGNOSIS — S7012XA Contusion of left thigh, initial encounter: Secondary | ICD-10-CM | POA: Diagnosis not present

## 2020-09-05 DIAGNOSIS — U071 COVID-19: Secondary | ICD-10-CM | POA: Diagnosis not present

## 2020-09-05 DIAGNOSIS — I10 Essential (primary) hypertension: Secondary | ICD-10-CM | POA: Diagnosis not present

## 2020-09-05 DIAGNOSIS — T148XXA Other injury of unspecified body region, initial encounter: Secondary | ICD-10-CM | POA: Diagnosis not present
